# Patient Record
Sex: Female | Born: 1950 | Race: White | Hispanic: No | Smoking: Former smoker
Health system: Southern US, Community
[De-identification: ages and names within clinical notes are randomized; demographics above are authoritative.]

## PROBLEM LIST (undated history)

## (undated) DIAGNOSIS — I509 Heart failure, unspecified: Secondary | ICD-10-CM

## (undated) DIAGNOSIS — T4145XA Adverse effect of unspecified anesthetic, initial encounter: Secondary | ICD-10-CM

## (undated) DIAGNOSIS — C801 Malignant (primary) neoplasm, unspecified: Secondary | ICD-10-CM

## (undated) DIAGNOSIS — M1990 Unspecified osteoarthritis, unspecified site: Secondary | ICD-10-CM

## (undated) DIAGNOSIS — T8859XA Other complications of anesthesia, initial encounter: Secondary | ICD-10-CM

## (undated) DIAGNOSIS — G4730 Sleep apnea, unspecified: Secondary | ICD-10-CM

## (undated) DIAGNOSIS — J449 Chronic obstructive pulmonary disease, unspecified: Secondary | ICD-10-CM

## (undated) DIAGNOSIS — R0600 Dyspnea, unspecified: Secondary | ICD-10-CM

## (undated) DIAGNOSIS — Z8489 Family history of other specified conditions: Secondary | ICD-10-CM

## (undated) HISTORY — PX: FRACTURE SURGERY: SHX138

## (undated) HISTORY — PX: COLONOSCOPY: SHX174

## (undated) HISTORY — PX: ABDOMINAL HYSTERECTOMY: SHX81

## (undated) HISTORY — PX: BREAST SURGERY: SHX581

---
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
## 1998-09-29 HISTORY — PX: MASTECTOMY: SHX3

## 2007-10-29 ENCOUNTER — Ambulatory Visit: Payer: Self-pay | Admitting: Family Medicine

## 2008-06-24 ENCOUNTER — Ambulatory Visit: Payer: Self-pay | Admitting: Family Medicine

## 2008-06-25 ENCOUNTER — Ambulatory Visit: Payer: Self-pay | Admitting: Family Medicine

## 2008-06-26 ENCOUNTER — Ambulatory Visit: Payer: Self-pay | Admitting: Family Medicine

## 2011-02-16 ENCOUNTER — Ambulatory Visit: Payer: Self-pay | Admitting: Internal Medicine

## 2019-04-27 ENCOUNTER — Other Ambulatory Visit: Payer: Self-pay

## 2019-04-27 ENCOUNTER — Encounter
Admission: RE | Admit: 2019-04-27 | Discharge: 2019-04-27 | Disposition: A | Discharge status: Home / Self Care | Payer: Medicare Other | Source: Ambulatory Visit | Attending: Surgery | Admitting: Surgery

## 2019-04-27 DIAGNOSIS — I10 Essential (primary) hypertension: Secondary | ICD-10-CM | POA: Diagnosis not present

## 2019-04-27 DIAGNOSIS — F418 Other specified anxiety disorders: Secondary | ICD-10-CM | POA: Diagnosis not present

## 2019-04-27 DIAGNOSIS — Z79899 Other long term (current) drug therapy: Secondary | ICD-10-CM | POA: Insufficient documentation

## 2019-04-27 DIAGNOSIS — G4733 Obstructive sleep apnea (adult) (pediatric): Secondary | ICD-10-CM | POA: Diagnosis not present

## 2019-04-27 DIAGNOSIS — Z20828 Contact with and (suspected) exposure to other viral communicable diseases: Secondary | ICD-10-CM | POA: Diagnosis not present

## 2019-04-27 DIAGNOSIS — F17210 Nicotine dependence, cigarettes, uncomplicated: Secondary | ICD-10-CM | POA: Diagnosis not present

## 2019-04-27 DIAGNOSIS — J449 Chronic obstructive pulmonary disease, unspecified: Secondary | ICD-10-CM | POA: Diagnosis not present

## 2019-04-27 DIAGNOSIS — Z01812 Encounter for preprocedural laboratory examination: Secondary | ICD-10-CM | POA: Diagnosis present

## 2019-04-27 DIAGNOSIS — M1711 Unilateral primary osteoarthritis, right knee: Secondary | ICD-10-CM | POA: Diagnosis not present

## 2019-04-27 HISTORY — DX: Chronic obstructive pulmonary disease, unspecified: J44.9

## 2019-04-27 HISTORY — DX: Sleep apnea, unspecified: G47.30

## 2019-04-27 HISTORY — DX: Malignant (primary) neoplasm, unspecified: C80.1

## 2019-04-27 HISTORY — DX: Other complications of anesthesia, initial encounter: T88.59XA

## 2019-04-27 HISTORY — DX: Dyspnea, unspecified: R06.00

## 2019-04-27 HISTORY — DX: Family history of other specified conditions: Z84.89

## 2019-04-27 HISTORY — DX: Unspecified osteoarthritis, unspecified site: M19.90

## 2019-04-27 HISTORY — DX: Heart failure, unspecified: I50.9

## 2019-04-27 LAB — BASIC METABOLIC PANEL
Anion gap: 11 (ref 5–15)
BUN: 25 mg/dL — ABNORMAL HIGH (ref 8–23)
CO2: 29 mmol/L (ref 22–32)
Calcium: 8.9 mg/dL (ref 8.9–10.3)
Chloride: 100 mmol/L (ref 98–111)
Creatinine, Ser: 1.16 mg/dL — ABNORMAL HIGH (ref 0.44–1.00)
GFR calc Af Amer: 56 mL/min — ABNORMAL LOW (ref >60)
GFR calc non Af Amer: 48 mL/min — ABNORMAL LOW (ref >60)
Glucose, Bld: 96 mg/dL (ref 70–99)
Potassium: 4.2 mmol/L (ref 3.5–5.1)
Sodium: 140 mmol/L (ref 135–145)

## 2019-04-27 LAB — TYPE AND SCREEN
ABO/RH(D): O POS
Antibody Screen: NEGATIVE

## 2019-04-27 LAB — PROTIME-INR
INR: 0.9 (ref 0.8–1.2)
Prothrombin Time: 12.2 seconds (ref 11.4–15.2)

## 2019-04-27 LAB — SURGICAL PCR SCREEN
MRSA, PCR: NEGATIVE
Staphylococcus aureus: NEGATIVE

## 2019-04-27 LAB — APTT: aPTT: 29 seconds (ref 24–36)

## 2019-04-27 LAB — URINALYSIS, ROUTINE W REFLEX MICROSCOPIC
Bilirubin Urine: NEGATIVE
Glucose, UA: NEGATIVE mg/dL
Hgb urine dipstick: NEGATIVE
Ketones, ur: NEGATIVE mg/dL
Leukocytes,Ua: NEGATIVE
Nitrite: NEGATIVE
Protein, ur: NEGATIVE mg/dL
Specific Gravity, Urine: 1.025 (ref 1.005–1.030)
pH: 6 (ref 5.0–8.0)

## 2019-04-27 LAB — CBC
HCT: 44.3 % (ref 36.0–46.0)
Hemoglobin: 14 g/dL (ref 12.0–15.0)
MCH: 31.3 pg (ref 26.0–34.0)
MCHC: 31.6 g/dL (ref 30.0–36.0)
MCV: 99.1 fL (ref 80.0–100.0)
Platelets: 226 10*3/uL (ref 150–400)
RBC: 4.47 MIL/uL (ref 3.87–5.11)
RDW: 13.1 % (ref 11.5–15.5)
WBC: 9.4 10*3/uL (ref 4.0–10.5)
nRBC: 0 % (ref 0.0–0.2)

## 2019-04-27 LAB — SEDIMENTATION RATE: Sed Rate: 9 mm/hr (ref 0–30)

## 2019-04-27 NOTE — Pre-Procedure Instructions (Signed)
Progress Notes - documented in this encounter Elaine Nevin, MD - 11/30/2018 1:40 PM EST Formatting of this note might be different from the original. Established Patient Visit   Reason for referral: Chief Complaint  Patient presents with  . Follow-up  Date of Service: 11/30/2018 Date of Birth: 01/01/1951 PCP: Valera Castle, MD  History of Present Illness: Ms. Tomassetti is a 68 y.o.female patient who has Depression; Irritability; Chronic low back pain; Moderate tobacco use disorder; Anxiety; Sinus tachycardia; Acute respiratory failure with hypoxia and hypercapnia (CMS-HCC); High serum lactate; Severe OSA. Worse in REM. Baseline hypoxia. (SPLIT-CMS 10/21/17: Overall AHI 75/hr, REM AHI 109/hr, NREM AHI 73/hr, Lateral AHI 75/hr); Tobacco use disorder, moderate, in early remission; COPD (chronic obstructive pulmonary disease) (CMS-HCC); Elevated hematocrit; Mild NICM (nonischemic cardiomyopathy) (CMS-HCC); Wheezing; Situational anxiety; Essential hypertension; Primary osteoarthritis of right knee; and Obesity (BMI 35.0-39.9 without comorbidity), unspecified on their problem list.   Goes by "J"  Cardiac Risk Factors: 68 y.o. 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 Tobacco, COPD chronic bronchitis, severe OSA on CPAP. Body mass index is 33.94 kg/m. Bipolar affective disorder.  Previously: Respiratory Failure: 06/2017 hospitalization 10 days, treatment with BiPAP. Acute hypoxic / hypercapneic. Nebs helped with treatment prior to hospitalization, but also some chest tightness like a rubber band around chest. Overall felt to be COPD exacerbation in setting of recent viral illness. Basic viral panel negative, extended panel (+) for rhinovirus. Burst prednisone, and moxifloxacin. Followed by outpatient sleep study and diagnosis of severe obstructive sleep apnea. Coronary Calcifications: Mild coronary calcifications noted on unrelated CT scan 2019.  11/18/2017 At the Initial Consultation:  for assessment of procedural cardiovascular risk prior to surgery for excision of malignant melanoma, right upper back (+margins), scheduled 11/24/2017. Assessment of Perioperative Risk: low risk for this low risk excision of melanoma on her back. Does not require additional testing prior to surgery. Chest Discomfort: intermittently heavy sensation in chest that takes her breath. Brief. Had this symptom while auscultated.  Palpitations: sounds like occasional ectopy. May consider echocardiogram in the future, but not preoperatively. Respiratory Failure: sounds like this was COPD exacerbation and not CHF.  5/19-5/24/2019 [facility] Hospitalization:  Shortness of Breath: presents from home c/o progressive dyspnea for past 2 weeks, with significant worsening over the past 2 days. She is in the process of moving out of her current home (lived there for 6-7y, hold home w/ dust that exacerbates bronchial asthma) and has been more stressed, sleeping less, and packing her belongings w/ exposure to dust in her home.  Treated for COPD exacerbation and newly diagnosed heart failure.  COPD: treated with 5 days of prednisone and bronchodilators and improved clinically. At the time of discharge her wheezing had resolved and her cough had vastly improved.  Congestive Heart Failure: mildly volume overloaded on presentation, TTE checked (see below) revealing EF of 40 percent. Cardiology recommended cMRI, negative for ischemia, EF 48%. Started on ACEI, coreg 3.125, statin 40 mg.  03/18/2018 Visit:  Onc: clean margins on surgery, per patient Pulmonary: saw this week, very early COPD versus small airways dz. Home O2Sat 89-94% Sleep Apnea: Autumn Patty recommended BiPAP titration: Poor tolerance of CPAP due to excessive pressures and high mask leaks. BiPAP at 19/12 cwp had significant benefit (AHI 1/hr at this pressure). Used FFM. Diuretic: only takes furosemide when she needs it. Blood Pressure: on lisinopril 2.5 daily and  carvedilol 3.125 BID. Doesn't check at home. Fatigue: very tired after not much exertion. Going on since hospital (? Start  of carvedilol). Stop carvedilol, double lisinopril, to 69m. Diuretic: furosemide PRN  11/30/2018 Visit:  Volume Status: 10/13/2018 11/10/2018 11/16/2018  NT-Pro-BNP <=350 pg/mL 384 (H) 782 (H) 669 (H)  GI: awaiting colonoscopy, started furosemide again (had been PRN) because of elevated Pro-BNP Congestive Heart Failure: no orthopnea / PND but DOE after 4-5 minutes walking on flat. Thighs get very weak (bilateral) after walking a few minutes. OSA on BiPAP: good adherence. Every night, but not all night, but most of the night (and sometimes all). Diuretic: daily since 2/12 when Pro-BNP returned high (three weeks ago). Ok to reduce to every other day. Fatigue: "no energy and I'm tired all the time." Energy is "going downhill." Has to pause during housework. Pulmonary: Sees Dr. OLawerance Cruel(pulm) 4/16.  Tobacco: Currently smoking 1/2ppd. At her max she was smoking 2ppd. Plans to get chantix.  Pulmonary Function Testing: The FVC and FEV1 are reduced in a pattern suggestive of combined obstruction and restriction. Cannot accurately estimate the severity of obstruction in the setting of a reduced FVC.   Review of Systems  Constitution: Negative for decreased appetite, fever, night sweats and weight loss.  HENT: Negative for hearing loss, hoarse voice and sore throat.  Cardiovascular: Positive for chest pain. Negative for syncope.  Respiratory: Positive for shortness of breath and wheezing. Negative for cough and hemoptysis.  Skin: Negative for color change and rash.  Musculoskeletal: Positive for joint pain and myalgias. Negative for joint swelling.  Gastrointestinal: Negative for abdominal pain, change in bowel habit, constipation, diarrhea, melena, nausea and vomiting.  Genitourinary: Negative for dysuria, frequency and hesitancy.  Neurological: Positive for loss of balance. Negative  for excessive daytime sleepiness and paresthesias.  Psychiatric/Behavioral: Positive for memory loss.   Past Medical and Surgical History   Past Medical History:  Diagnosis Date  . Acute respiratory failure with hypoxia and hypercapnia (CMS-HCC) 07/14/2017  . Anxiety 07/13/2013  . Asthma  . Chronic low back pain  . COPD (chronic obstructive pulmonary disease) (CMS-HCC) 07/14/2017  . Depression  . Elevated hematocrit 12/09/2017  . Essential hypertension 03/29/2018  . High serum lactate 07/14/2017  . History of anesthesia reaction  reports she requires quite a bit of anesthesia to go to sleep  . 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 Melanoma of back (CMS-HCC) 11/04/2017  . Mild NICM (nonischemic cardiomyopathy) (CMS-HCC) 03/18/2018  . OSA (obstructive sleep apnea)  . Respiratory failure (CMS-HCC)  on Bipap, 10 day hospital stay October 2018  . Tobacco use   Past Surgical History She has a past surgical history that includes Hysterectomy; ankle surgery (Right, 1980); Tonsillectomy; Incisional Biopsy Breast; Breast surgery (1997); Colonoscopy; excision malignant lesion back (Right, 11/24/2017); pr resuperf wnd face >30 cm (Right, 11/24/2017); Mastectomy (Bilateral); and colonoscopy (N/A, 11/17/2018).   Medications and Allergies   Current Outpatient Medications  Medication Sig Dispense Refill  . acetaminophen (TYLENOL) 325 MG tablet Take 162.5 mg by mouth 2 (two) times daily as needed for Pain  . albuterol (ACCUNEB) 0.63 mg/3 mL nebulizer solution Take 0.63 mg by nebulization every 6 (six) hours as needed for Wheezing  . atorvastatin (LIPITOR) 40 MG tablet Take 1 tablet (40 mg total) by mouth once daily 90 tablet 3  . buPROPion (WELLBUTRIN XL) 300 MG XL tablet Take 300 mg by mouth once daily 0  . carvedilol (COREG) 3.125 MG tablet Take 1 tablet (3.125 mg total) by mouth every 12 (twelve) hours 180 tablet 3  .  dextromethorphan-guaifenesin (MUCINEX DM) 30-600 mg ER tablet Take 1 tablet by mouth every 12 (twelve) hours as needed  . divalproex (DEPAKOTE ER) 500 MG ER tablet Take 1,000 mg by mouth nightly 0  . fluticasone propionate (FLONASE) 50 mcg/actuation nasal spray Place 2 sprays into both nostrils once daily as needed for Rhinitis  . FUROsemide (LASIX) 20 MG tablet Take 1 tablet (20 mg total) by mouth once daily as needed for Edema (use if weight gain is 2-3 pounds from previous day) 30 tablet 11  . guaiFENesin (MUCUS RELIEF) 400 mg Tab tablet Take 400 mg by mouth  . hydrocortisone 2.5 % cream Apply topically once daily as needed  . ibuprofen (MOTRIN) 200 MG tablet Take 200 mg by mouth every 6 (six) hours as needed for Pain  . ipratropium-albuterol (COMBIVENT RESPIMAT) 20-100 mcg/actuation inhaler Inhale 1 inhalation into the lungs 4 (four) times daily 12 g 3  . ipratropium-albuterol (DUO-NEB) nebulizer solution Take 3 mLs by nebulization 4 (four) times daily as needed for Wheezing or Shortness of Breath 180 mL 11  . lisinopril (ZESTRIL) 5 MG tablet Take 1 tablet (5 mg total) by mouth once daily 90 tablet 3  . potassium chloride (KLOR-CON) 20 MEQ ER tablet Take 1 tablet (20 mEq total) by mouth 2 (two) times daily 180 tablet 3  . traZODone (DESYREL) 50 MG tablet Take 100 mg by mouth nightly  . venlafaxine (EFFEXOR-XR) 75 MG XR capsule Take 75 mg by mouth once daily 0  . Herbal Supplement Take 2 capsules by mouth once daily STACKER 3  . HYDROcodone-acetaminophen (NORCO) 5-325 mg tablet TAKE 1 TO 2 TABLETS BY MOUTH EVERY 6 TO 8 HOURS AS NEEDED FOR PAIN  . meloxicam (MOBIC) 7.5 MG tablet Take 1 tablet (7.5 mg total) by mouth once daily as needed for Pain (Patient not taking: Reported on 11/30/2018 ) 30 tablet 3   No current facility-administered medications for this visit.   Allergies: Patient has no known allergies.  Social and Family History   Social History   Tobacco Use  . Smoking status:  Current Every Day Smoker  Packs/day: 0.80  Years: 45.00  Pack years: 36.00  Types: Cigarettes  . Smokeless tobacco: Never Used  . Tobacco comment: Quit on 07/08/2017, but has now resumed smoking as of the spring of 2019  Substance Use Topics  . Alcohol use: Yes  Comment: ocassional beer or two or vodka/tonic - perhaps 1 every 2 weeks  . Drug use: No   Family History: family history includes Anesthesia problems in her sister; Bipolar disorder in her father; Breast cancer in her mother; COPD in her father; Colon polyps in her mother; Myocardial Infarction (Heart attack) in her father and mother.  Physical Examination  BP 106/68 (BP Location: Right upper arm, Patient Position: Sitting, BP Cuff Size: Large Adult)  Pulse 87  Ht 162.6 cm ('5\' 4"' )  Wt 89.7 kg (197 lb 12 oz)  BMI 33.94 kg/m  Physical Exam  Constitutional: She appears healthy. No distress.  HENT:  Mouth/Throat: Oropharynx is clear.  Eyes: Pupils are equal, round, and reactive to light. Conjunctivae are normal.  Neck: Normal range of motion. No JVD present. No neck adenopathy. No thyromegaly present.  Cardiovascular: Normal rate, regular rhythm and normal heart sounds.  Pulses: Carotid pulses are 2+ on the right side and 2+ on the left side. Radial pulses are 2+ on the right side and 2+ on the left side.  Femoral pulses are 2+ on the right side  and 2+ on the left side. Dorsalis pedis pulses are 2+ on the right side and 2+ on the left side.  Posterior tibial pulses are 2+ on the right side and 2+ on the left side.  Pulmonary/Chest: Effort normal. No stridor. She has no wheezes. She has no rales. She exhibits no tenderness.  Abdominal: Soft. Bowel sounds are normal. She exhibits no distension and no mass. There is no abdominal tenderness.  Musculoskeletal:  General: No edema.  Neurological: She is alert.  Skin: Skin is warm and dry. No cyanosis. No jaundice or plethora. Nails show no clubbing.   Lab and Other Data    10/21/2017 Split Sleep Study: Severe obstructive sleep apnea (AHI 75/hr). Apnea was worse during REM sleep. CPAP at 18 cwp captured REM, but not supine, sleep and had significant benefit in improving the patient's quality of sleep (AHI 1/hr at this pressure). Hypoxia with a baseline SpO2 of 86 during PAP titration.  02/15/2018 Echocardiogram: MODERATE LV DYSFUNCTION (LVEF 40%) NORMAL RIGHT VENTRICULAR SYSTOLIC FUNCTION VALVULAR REGURGITATION: MILD AR, MILD MR, TRIVIAL PR, TRIVIAL TR NO PRIOR STUDY FOR COMPARISON  02/18/2018 Cardiac MRI: Non-ischemic dilated cardiomyopathy. Left ventricle mildly dilated. There is mild concentric LV hypertrophy. Global systolic function mildly reduced, LV ejection fraction 48%. Global mild hypokinesis. Right ventricle normal in size, wall thickness, and systolic function.Both atria mildly enlarged.Aortic valve is trileaflet in morphology without significant stenosis. Peak aortic flow velocity is 1.8 m/s. There is mild-moderate aortic regurgitation. Mild pulmonic regurgitation and trivial mitral and tricuspid regurgitation. Delayed enhancement imaging demonstrates no evidence of myocardial infarction, scar or infiltrative disease. Regadenoson stress perfusion imaging demonstrates no inducible ischemia. No intracardiac thrombus visualized.  Assessment and Plan   1. Mild NICM (nonischemic cardiomyopathy) (CMS-HCC)  2. Essential hypertension  3. Other emphysema (CMS-HCC)    Dyspnea on Exertion: relatively benign cardiac MRI. Most salient abnormalities are from her PFTs. She sees pulmonary soon and continues to smoke (plans to do chantix when discount paperwork goes through). Diuretic: now daily because Pro-BNP was high; that said, no orthopnea, paroxysmal nocturnal dyspnea, edema. Reduce to QOD and continue to adjust clinically. Disposition: Return in about 6 months (around 06/02/2019).   Requested Prescriptions   No prescriptions requested or ordered in this  encounter   Non-Medicine Orders: No orders of the defined types were placed in this encounter.  Future Appointments  Date Time Provider [place]  12/09/2018 3:15 PM Scarlett Presto, [place] [facility]  01/13/2019 9:30 AM STUDIES/LAB-ASTHMA, Reggie Pile [facility]  01/13/2019 10:00 AM Dow Adolph, MD [facility]  02/14/2019 10:20 AM Valera Castle, MD [facility] [place]  03/25/2019 10:00 AM Taxman, Danice Goltz, PA [facility]     Electronically signed by Elaine Nevin, MD at 11/30/2018 4:59 PM EST   Plan of Treatment - documented as of this encounter Upcoming Encounters Upcoming Encounters  Date Type Specialty Care Team Description  04/29/2019 Office Visit Orthopaedics Poggi, Smith Mince, MD  [place]  [facility] [place], [place] [address]  [phone]  [phone] ([address])    Scarlett Presto, [place]  [place], [place] [address]  [phone]  [phone] (Fax)    05/19/2019 Post Op Orthopaedics Scarlett Presto, [place]  [place], [place] [address]  [phone]  [phone] (Fax)    05/19/2019 PT/OT Office Visit Physical Therapy Juanda Chance, PT    06/22/2019 Post Op Orthopaedics Poggi, Smith Mince, MD  [place]  [facility] [place], [place] [address]  [phone]  [phone] (Fax)     Visit Diagnoses - documented in this encounter Diagnosis  Mild NICM (nonischemic cardiomyopathy) (CMS-HCC) - Primary   Essential hypertension   Other emphysema (CMS-HCC)  Other emphysema    Historical Medications - added in this encounter This list may reflect changes made after this encounter.  Medication Sig Dispensed Refills Start Date End Date  guaiFENesin (MUCUS RELIEF) 400 mg Tab tablet  Take 400 mg by mouth  0    Images  Patient Contacts  Contact Name Contact Address Communication Relationship  to Patient  Braylea Brancato Unknown [phone] [facility]) [phone] (Mobile) Son or Daughter, Emergency Contact  Document Information  Primary Care Provider Other Service Providers Document Coverage Dates  Valera Castle, MD (Oct. 16, 2018October 16, 2018 - Present) DM: [phone] (Work) [phone] (Fax) [place], [place] [address] Family Medicine [facility] [place], [place] [address] Jannet Mantis, MD (Consulting Provider) DM: [phone] (Work) [phone] (Fax) [address] [place], [place] [address] Dermatology Elaine Nevin, MD (Cardiologist) DM: [phone] (Work) [phone] (Fax) [address] [place], [place] [address] Cardiovascular Disease [facility] [place], [place] [address] Mar. 03, 2020March 03, 2020   [place] [address] [place],  [address]   Encounter Providers Encounter Date  Elaine Nevin, MD (Attending) DM: [phone] (Work) [phone] (Fax) [phone] [place],  [address] Cardiovascular Disease Mar. 03, 2020March 03, 2020

## 2019-04-27 NOTE — Pre-Procedure Instructions (Signed)
ECG 12-lead2/08/2019 [place] Component Name Value Ref Range  Vent Rate (bpm) 66   PR Interval (msec) 132   QRS Interval (msec) 104   QT Interval (msec) 396   QTc (msec) 415   Other Result Information  This result has an attachment that is not available.  Result Narrative  Normal sinus rhythm Left atrial enlargement Left axis deviation less prominent, Left anterior fascicular block now absent Nonspecific ST depression Nonspecific T wave abnormalities, Inferolateral leads Abnormal ECG  When compared with ECG of 10-Nov-2018 10:37, Nonspecific ST depression is now present Nonspecific T wave abnormalities, Inferolateral leads are now present I reviewed and concur with this report. Electronically signed HQ:IONGE, MD, AUGUSTUS ([id number]) on 11/10/2018 7:44:07 PM  Status Results Details

## 2019-04-27 NOTE — Pre-Procedure Instructions (Signed)
Progress Notes - documented in this encounter Dow Adolph, MD - 07/15/2018 10:40 AM EDT Formatting of this note might be different from the original.   Primary Care Provider: Valera Castle, MD  Problem List: - COPD with peripheral eosinophilia  - Ground glass pulmonary nodule  - Severe OSA (AHI 75) on BiPAP  - Obesity - Tobacco abuse, ongoing   Chief Complaint: routine COPD follow up   Patient Summary: This is a 68 y.o. female who is a former patient of Dr. Tamsen Roers seen for COPD with recurrent exacerbations requiring hospitalization, last in 01/2018.   Interval History:  Was in the hospital in May for respiratory failure, has since recovered from that perspective  For the last 2 months she has been doing very well. She has stopped Symbicort and the nebulizer for the last 2 and a half months. She thinks she may need to restart because of the change of weather. She has started to develop a cough. May notice some increased shortness of breath.   She is not currently needing Combivent.   She typically stops her respiratory medications when she is feeling well, has never been told to use consistently per her recollection.   Now she is on BiPAP nocturnally which has overall improved her sleep quality. Occasionally wakes up at night. When she was on CPAP alone, she was waking up 4-6 times per night.   Currently smoking 1/2ppd. At her max she was smoking 2ppd. Admits she is under a lot of stress and pressure. She has tried patches and gum in the past. She has seen our smoking cessation program once. She would prefer to get her life settled before she goes to smoking cessation. She is taking care of her sister's children right now who suffer from ADHD, PTSD, anxiety and so spends a lot of her time with the kids and going to the psychiatrist for them.   Review of Systems.  Review of Systems  Constitutional: Positive for malaise/fatigue.  HENT: Negative.  Respiratory:  Positive for cough and shortness of breath. Negative for sputum production and wheezing.  Cardiovascular: Negative for chest pain.  Psychiatric/Behavioral:  Multiple stressors in life   Allergies: No Known Allergies  Current Medications: Current Outpatient Medications  Medication Sig Dispense Refill   atorvastatin (LIPITOR) 40 MG tablet Take 1 tablet (40 mg total) by mouth once daily 30 tablet 3   buPROPion (WELLBUTRIN XL) 150 MG XL tablet 0   carvedilol (COREG) 3.125 MG tablet Take 3.125 mg by mouth every 12 (twelve) hours 3   cetirizine (ZYRTEC) 10 mg capsule Take 1 capsule (10 mg total) by mouth once daily 30 capsule 11   dextromethorphan-guaifenesin (MUCINEX DM) 30-600 mg ER tablet Take 1 tablet by mouth every 12 (twelve) hours as needed   divalproex (DEPAKOTE ER) 250 MG ER tablet 0   fluticasone (FLONASE) 50 mcg/actuation nasal spray Place 2 sprays into both nostrils once daily 16 g 11   FUROsemide (LASIX) 20 MG tablet Take 1 tablet (20 mg total) by mouth once daily as needed for Edema (use if weight gain is 2-3 pounds from previous day) 30 tablet 11   Herbal Supplement Take 2 capsules by mouth once daily STACKER 3   hydrocortisone 2.5 % cream Apply topically once daily as needed   ipratropium-albuterol (DUO-NEB) nebulizer solution Take 3 mLs by nebulization 4 (four) times daily as needed for Wheezing or Shortness of Breath 180 mL 11   lisinopril (PRINIVIL,ZESTRIL) 5 MG tablet Take 1  tablet (5 mg total) by mouth once daily 90 tablet 3   meloxicam (MOBIC) 7.5 MG tablet Take 1 tablet (7.5 mg total) by mouth once daily as needed for Pain 30 tablet 3   montelukast (SINGULAIR) 10 mg tablet Take 1 tablet (10 mg total) by mouth nightly 30 tablet 11   venlafaxine (EFFEXOR-XR) 37.5 MG XR capsule 0   budesonide-formoterol (SYMBICORT) 160-4.5 mcg/actuation inhaler Inhale 2 inhalations into the lungs 2 (two) times daily 3 Inhaler 3   ipratropium-albuterol (COMBIVENT RESPIMAT)  20-100 mcg/actuation inhaler Inhale 2 inhalations into the lungs 4 (four) times daily 1 Inhaler 11   No current facility-administered medications for this visit.   Physical Exam: Vitals:  07/15/18 1029  BP: 142/82  BP Location: Right upper arm  Patient Position: Sitting  BP Cuff Size: Large Adult  Pulse: 79  Resp: 18  Temp: 36.4 C (97.6 F)  TempSrc: Oral  SpO2: 95%  Weight: 95 kg (209 lb 7 oz)  Height: 162.6 cm (5' 4.02")   Body mass index is 35.93 kg/m. Gen: alert and oriented, no acute distress, +tobacco smell  Cardiovascular: regular rate and rhythm, normal s1s2 Lungs: diminished but clear to ausculation bilaterally, no wheezing, rhonchi or crackles Extremities: no edema  Labs/Data/Imaging Review:  Lab Results  Component Value Date  WBC 9.1 06/28/2018  HGB 14.9 06/28/2018  HCT 45.2 (H) 06/28/2018  MCV 95 06/28/2018  PLT 202 06/28/2018   Absolute eosinophil count 400 (06/28/2018) Lab Results  Component Value Date  CREATININE 1.0 06/28/2018  BUN 21 (H) 06/28/2018  NA 143 06/28/2018  K 4.3 06/28/2018  CL 104 06/28/2018  CO2 27 06/28/2018   Lab Results  Component Value Date  ALT 14 02/14/2018  AST 19 02/14/2018  ALKPHOS 72 02/14/2018   Pulmonary Function Tests (03/15/2018): Personally interpreted. The FVC and FEV1 are reduced in a pattern suggestive of combined obstruction and restriction. Cannot accurately estimate the severity of obstruction in the setting of a reduced FVC.   Pulmonary Function Test (PFT) Latest Ref Rng & Units 07/27/2017 03/15/2018  FVC PRE L 2.22 2.06  FVC % PRE PRED % 77.4 68.2  FEV1 PRE L 1.64 1.52  FEV1 % PRE PRED % 73.7 65.4  FEV1/FVC PRE % 73.73 73.85  TLC PRE L 4.47 -  TLC % PRE PRED % 90 -  RV PRE L 2.25 -  RV % PRE PRED % 107.2 -  DLCO PRE ml/(min*mmHg) 16.82 16.17  DLCO % PRE PRED % 93.9 88.8  FEF25-75% PRE L/s 1.18 1.05  FEF25-75% % PRE PRED % 56.6 49.3   Assessment: 1. COPD with peripheral eosinophilia 2. Tobacco  abuse, ongoing  3. Medication non-adherence 4. LUL pulmonary nodule 5. Severe OSA on BiPAP   Discussion: She is overall doing well, but has recurrent exacerbations likely related to her stopping her medications when she is feeling well. I do agree that ICS based therapy should be a part of her therapy even though her primary diagnosis is asthma given her history of peripheral eosinophilia. I spent today educating her on why controller therapies need to be used continuously rather than prn and she was appreciative of that information.   For her L apical pulmonary nodule, will repeat CT chest in 6 months.   We discussed smoking cessation in detail today, she plans to fill her Chantix prescription but is not yet ready for a smoking cessation referral.   Plan: - CT chest in November for 6 months follow up of LUL pulmonary  nodule  - Restart Symbicort 160/4.5 2 puff BID and use consistently whether feeling well or now - She will meet with our [facility] due to concerns for cost  - Continue Combivent or Duoneb prn (refilled today) - Smoking cessation counseling 5-10 minutes.  - Declined smoking cessation counseling referral  - Plans to start Chantix soon  - Continue BiPAP for OSA  - She received her flu shot this year already - Plans to get her pneumovax after 07/27/18 of this year  - Up to date on prevnar   Diagnoses and all orders for this visit:  COPD, mild , unspecified (CMS-HCC) - ipratropium-albuterol (DUO-NEB) nebulizer solution; Take 3 mLs by nebulization 4 (four) times daily as needed for Wheezing or Shortness of Breath - ipratropium-albuterol (COMBIVENT RESPIMAT) 20-100 mcg/actuation inhaler; Inhale 2 inhalations into the lungs 4 (four) times daily - Flow Volume Loop; Future  Lung nodule - CT chest without contrast; Future  Chronic obstructive pulmonary disease, unspecified COPD type (CMS-HCC) - budesonide-formoterol (SYMBICORT) 160-4.5 mcg/actuation inhaler; Inhale 2 inhalations into  the lungs 2 (two) times daily  Immunizations:  Immunization History  Administered Date(s) Administered   Influenza IIV3, IM High Dose 07/21/2017, 06/28/2018   Pneumococcal Conjugate 13-Valent (Prevnar 13) 07/27/2017   Tdap 06/22/2015   Varicella zoster (Shingrix) 03/11/2018   Return in about 6 months (around 01/14/2019) for COPD follow up.  Claris Pong, MD [facility]  I personally performed the service. (TP)    Electronically signed by Dow Adolph, MD at 07/15/2018 11:29 AM EDT   Plan of Treatment - documented as of this encounter Upcoming Encounters Upcoming Encounters  Date Type Specialty Care Team Description  04/29/2019 Office Visit Orthopaedics Poggi, Smith Mince, MD  [place]  [facility] [place], [place] [address]  [phone]  [phone] ([address]    Scarlett Presto, [place]  [place], [place] [address]  [phone]  [phone] (Fax)    05/19/2019 Post Op Orthopaedics Scarlett Presto, [place]  [place], [place] [address]  [phone]  [phone] ([phone]    05/19/2019 PT/OT Office Visit Physical Therapy Juanda Chance, PT    06/22/2019 Post Op Orthopaedics Poggi, Smith Mince, MD  [place]  [facility] [place], [place] [address]  [phone]  [phone] (Fax)     Scheduled Orders Scheduled Orders  Name Type Priority Associated Diagnoses Order Schedule  Flow Volume Loop PFT Routine COPD, mild , unspecified (CMS-HCC)  Expected: 01/14/2019, Expires: 07/16/2019  CT chest without contrast with 3D MIPS Protocol Imaging Routine Lung nodule  Expected: 08/15/2018, Expires: 07/16/2019   Visit Diagnoses - documented in this encounter Diagnosis  Chronic obstructive pulmonary disease, unspecified COPD type (CMS-HCC) - Primary   Lung nodule  Other diseases of lung, not elsewhere classified   Tobacco abuse  Tobacco  use disorder   COPD, mild , unspecified (CMS-HCC)    Discontinued Medications - documented as of this encounter Medication Sig Discontinue Reason Start Date End Date  ipratropium-albuterol (COMBIVENT RESPIMAT) 20-100 mcg/actuation inhaler  Indications: Wheezing Inhale 1 inhalation into the lungs 4 (four) times daily.  06/22/2017 07/15/2018  ipratropium-albuterol (DUO-NEB) nebulizer solution  Indications: COPD, mild , unspecified (CMS-HCC) Take 3 mLs by nebulization 4 (four) times daily as needed for Wheezing or Shortness of Breath Reorder 01/13/2018 07/15/2018  Images  Patient Contacts  Contact Name Contact Address Communication Relationship to Patient  Avanni Turnbaugh Unknown [phone] [facility]) [phone] [facility]) Son  or Daughter, Emergency Contact  Document Information  Primary Care Provider Other Service Providers Document Coverage Dates  Valera Castle, MD (Oct. 16, 2018October 16, 2018 - Present) DM: [phone] (Work) [phone] (Fax) [place], [place] [address] Family Medicine [facility] [place], [place] [address] Jannet Mantis, MD (Consulting Provider) DM: [phone] (Work) [phone] (Fax) [address] [place], [place] [address] Dermatology Shela Nevin, MD (Cardiologist) DM: [phone] (Work) [phone] (Fax) [address] [place], [place] [address] Cardiovascular Disease [facility] [address] [place], [place] [address] Oct. 17, 2019October 17, 2019   [place] [address] [place], [place] [address]   Encounter Providers Encounter Date  Amber Charlotte Crumb, MD (Attending) DM: [phone] (Work) [phone] (Fax) [place], [place],  [address] Pulmonary Disease Oct. 17, 2019October 17, 2019

## 2019-04-27 NOTE — Patient Instructions (Addendum)
Your procedure is scheduled on: 05-04-19 [facility] Report to Same Day Surgery 2nd floor medical mall [facility] Entrance-take elevator on left to 2nd floor.  Check in with surgery information desk.) To find out your arrival time please call [phone] between 1PM - 3PM on 05-03-19 TUESDAY  Remember: Instructions that are not followed completely may result in serious medical risk, up to and including death, or upon the discretion of your surgeon and anesthesiologist your surgery may need to be rescheduled.    _x___ 1. Do not eat food after midnight the night before your procedure. NO GUM OR CANDY AFTER MIDNIGHT. You may drink clear liquids up to 2 hours before you are scheduled to arrive at the hospital for your procedure.  Do not drink clear liquids within 2 hours of your scheduled arrival to the hospital.  Clear liquids include  --Water or Apple juice without pulp  --Clear carbohydrate beverage such as ClearFast or Gatorade  --Black Coffee or Clear Tea (No milk, no creamers, do not add anything to the coffee or Tea   ____Ensure clear carbohydrate drink on the way to the hospital for bariatric patients  ____Ensure clear carbohydrate drink 3 hours before surgery.    __x__ 2. No Alcohol for 24 hours before or after surgery.   __x__3. No Smoking or e-cigarettes for 24 prior to surgery.  Do not use any chewable tobacco products for at least 6 hour prior to surgery   ____  4. Bring all medications with you on the day of surgery if instructed.    __x__ 5. Notify your doctor if there is any change in your medical condition     (cold, fever, infections).    x___6. On the morning of surgery brush your teeth with toothpaste and water.  You may rinse your mouth with mouth wash if you wish.  Do not swallow any toothpaste or mouthwash.   Do not wear jewelry, make-up, hairpins, clips or nail polish.  Do not wear lotions, powders, or perfumes. You may wear deodorant.  Do not shave 48 hours prior  to surgery. Men may shave face and neck.  Do not bring valuables to the hospital.    [facility] is not responsible for any belongings or valuables.               Contacts, dentures or bridgework may not be worn into surgery.  Leave your suitcase in the car. After surgery it may be brought to your room.  For patients admitted to the hospital, discharge time is determined by your treatment team.  _  Patients discharged the day of surgery will not be allowed to drive home.  You will need someone to drive you home and stay with you the night of your procedure.    Please read over the following fact sheets that you were given:   [facility] Preparing for Surgery and or MRSA Information   _x___ TAKE THE FOLLOWING MEDICATION THE MORNING OF SURGERY WITH A SMALL SIP OF WATER. These include:  1. LIPITOR (ATORVASTATIN)  2. EFFEXOR (VENLAFAXINE)  3. WELLBUTRIN (BUPROPRION)  4.  5.  6.  ____Fleets enema or Magnesium Citrate as directed.   _x___ Use CHG Soap or sage wipes as directed on instruction sheet   _X___ Use inhalers on the day of surgery and bring to hospital day of surgery-USE YOUR NEBULIZER AND COMBIVENT THE MORNING OF SURGERY  ____ Stop Metformin and Janumet 2 days prior to surgery.    ____ Take  1/2 of usual insulin dose the night before surgery and none on the morning surgery.   _x___ Follow recommendations from Cardiologist, Pulmonologist or PCP regarding stopping Aspirin, Coumadin, Plavix ,Eliquis, Effient, or Pradaxa, and Pletal.  X____Stop Anti-inflammatories such as Advil, Aleve, Ibuprofen, Motrin, Naproxen,MELOXICAM, Naprosyn, Goodies powders or aspirin products NOW-OK to take Tylenol OR TRAMADOL   _X___ Stop supplements until after surgery-STOP STACKER 3 NOW   _X___ Bring BI-Pap to the hospital.   [place] ON Friday, July 31ST AND COME FOR YOUR COVID DRIVE THRU BETWEEN 1BP-79:43 AM

## 2019-04-28 LAB — URINE CULTURE: Culture: <10000 — AB

## 2019-04-29 ENCOUNTER — Other Ambulatory Visit: Payer: Self-pay

## 2019-04-29 ENCOUNTER — Other Ambulatory Visit
Admission: RE | Admit: 2019-04-29 | Discharge: 2019-04-29 | Disposition: A | Discharge status: Home / Self Care | Payer: Medicare Other | Source: Ambulatory Visit | Attending: Surgery | Admitting: Surgery

## 2019-04-29 DIAGNOSIS — Z01812 Encounter for preprocedural laboratory examination: Secondary | ICD-10-CM | POA: Diagnosis not present

## 2019-04-29 LAB — SARS CORONAVIRUS 2 (TAT 6-24 HRS): SARS Coronavirus 2: NEGATIVE

## 2019-05-04 ENCOUNTER — Inpatient Hospital Stay: Payer: Medicare Other | Admitting: Anesthesiology

## 2019-05-04 ENCOUNTER — Other Ambulatory Visit: Payer: Self-pay

## 2019-05-04 ENCOUNTER — Inpatient Hospital Stay: Payer: Medicare Other

## 2019-05-04 ENCOUNTER — Encounter: Payer: Self-pay | Admitting: *Deleted

## 2019-05-04 ENCOUNTER — Encounter
Admission: RE | Disposition: A | Discharge status: Skilled Nursing Facility | Payer: Self-pay | Source: Home / Self Care | Attending: Surgery

## 2019-05-04 ENCOUNTER — Inpatient Hospital Stay
Admission: RE | Admit: 2019-05-04 | Discharge: 2019-05-09 | DRG: 469 | Disposition: A | Discharge status: Skilled Nursing Facility | Payer: Medicare Other | Attending: Surgery | Admitting: Surgery

## 2019-05-04 DIAGNOSIS — F329 Major depressive disorder, single episode, unspecified: Secondary | ICD-10-CM | POA: Diagnosis present

## 2019-05-04 DIAGNOSIS — Z79891 Long term (current) use of opiate analgesic: Secondary | ICD-10-CM | POA: Diagnosis not present

## 2019-05-04 DIAGNOSIS — Z79899 Other long term (current) drug therapy: Secondary | ICD-10-CM

## 2019-05-04 DIAGNOSIS — Z90710 Acquired absence of both cervix and uterus: Secondary | ICD-10-CM | POA: Diagnosis not present

## 2019-05-04 DIAGNOSIS — Z87891 Personal history of nicotine dependence: Secondary | ICD-10-CM

## 2019-05-04 DIAGNOSIS — I5032 Chronic diastolic (congestive) heart failure: Secondary | ICD-10-CM | POA: Diagnosis present

## 2019-05-04 DIAGNOSIS — G4733 Obstructive sleep apnea (adult) (pediatric): Secondary | ICD-10-CM | POA: Diagnosis present

## 2019-05-04 DIAGNOSIS — Z20828 Contact with and (suspected) exposure to other viral communicable diseases: Secondary | ICD-10-CM | POA: Diagnosis present

## 2019-05-04 DIAGNOSIS — F419 Anxiety disorder, unspecified: Secondary | ICD-10-CM | POA: Diagnosis present

## 2019-05-04 DIAGNOSIS — Z885 Allergy status to narcotic agent status: Secondary | ICD-10-CM

## 2019-05-04 DIAGNOSIS — Z9013 Acquired absence of bilateral breasts and nipples: Secondary | ICD-10-CM

## 2019-05-04 DIAGNOSIS — E875 Hyperkalemia: Secondary | ICD-10-CM | POA: Diagnosis not present

## 2019-05-04 DIAGNOSIS — J9621 Acute and chronic respiratory failure with hypoxia: Secondary | ICD-10-CM | POA: Diagnosis not present

## 2019-05-04 DIAGNOSIS — Z791 Long term (current) use of non-steroidal anti-inflammatories (NSAID): Secondary | ICD-10-CM | POA: Diagnosis not present

## 2019-05-04 DIAGNOSIS — J189 Pneumonia, unspecified organism: Secondary | ICD-10-CM | POA: Diagnosis not present

## 2019-05-04 DIAGNOSIS — I959 Hypotension, unspecified: Secondary | ICD-10-CM | POA: Diagnosis not present

## 2019-05-04 DIAGNOSIS — M1711 Unilateral primary osteoarthritis, right knee: Principal | ICD-10-CM | POA: Diagnosis present

## 2019-05-04 DIAGNOSIS — J440 Chronic obstructive pulmonary disease with acute lower respiratory infection: Secondary | ICD-10-CM | POA: Diagnosis not present

## 2019-05-04 DIAGNOSIS — Z853 Personal history of malignant neoplasm of breast: Secondary | ICD-10-CM | POA: Diagnosis not present

## 2019-05-04 DIAGNOSIS — M79661 Pain in right lower leg: Secondary | ICD-10-CM

## 2019-05-04 DIAGNOSIS — I110 Hypertensive heart disease with heart failure: Secondary | ICD-10-CM | POA: Diagnosis present

## 2019-05-04 DIAGNOSIS — E785 Hyperlipidemia, unspecified: Secondary | ICD-10-CM | POA: Diagnosis present

## 2019-05-04 DIAGNOSIS — Z96651 Presence of right artificial knee joint: Secondary | ICD-10-CM

## 2019-05-04 HISTORY — PX: TOTAL KNEE ARTHROPLASTY: SHX125

## 2019-05-04 LAB — ABO/RH: ABO/RH(D): O POS

## 2019-05-04 SURGERY — ARTHROPLASTY, KNEE, TOTAL
Anesthesia: Spinal | Laterality: Right

## 2019-05-04 MED ORDER — MIDAZOLAM HCL 5 MG/5ML IJ SOLN
INTRAMUSCULAR | Status: DC | PRN
  Administered 2019-05-04 (×2): 1 mg via INTRAVENOUS

## 2019-05-04 MED ORDER — BUPROPION HCL ER (XL) 150 MG PO TB24
300.0000 mg | ORAL_TABLET | Freq: Every morning | ORAL | Status: DC
  Administered 2019-05-05 – 2019-05-09 (×5): 300 mg via ORAL
  Filled 2019-05-04 (×5): qty 2

## 2019-05-04 MED ORDER — SODIUM CHLORIDE (PF) 0.9 % IJ SOLN
INTRAMUSCULAR | Status: AC
  Filled 2019-05-04: qty 50

## 2019-05-04 MED ORDER — SODIUM CHLORIDE 0.9 % IV SOLN
INTRAVENOUS | Status: DC | PRN
  Administered 2019-05-04: 60 mL

## 2019-05-04 MED ORDER — HYDROMORPHONE HCL 1 MG/ML IJ SOLN
0.5000 mg | INTRAMUSCULAR | Status: DC | PRN
  Administered 2019-05-04 – 2019-05-06 (×2): 1 mg via INTRAVENOUS
  Filled 2019-05-04 (×2): qty 1

## 2019-05-04 MED ORDER — OXYCODONE HCL 5 MG PO TABS
5.0000 mg | ORAL_TABLET | Freq: Once | ORAL | Status: AC
  Administered 2019-05-04: 5 mg via ORAL

## 2019-05-04 MED ORDER — LIDOCAINE HCL (PF) 2 % IJ SOLN
INTRAMUSCULAR | Status: AC
  Filled 2019-05-04: qty 10

## 2019-05-04 MED ORDER — BUPIVACAINE HCL (PF) 0.5 % IJ SOLN
INTRAMUSCULAR | Status: DC | PRN
  Administered 2019-05-04: 3 mL

## 2019-05-04 MED ORDER — BISACODYL 10 MG RE SUPP
10.0000 mg | Freq: Every day | RECTAL | Status: DC | PRN
  Administered 2019-05-07: 11:00:00 10 mg via RECTAL
  Filled 2019-05-04: qty 1

## 2019-05-04 MED ORDER — PROPOFOL 500 MG/50ML IV EMUL
INTRAVENOUS | Status: DC | PRN
  Administered 2019-05-04: 40 ug/kg/min via INTRAVENOUS

## 2019-05-04 MED ORDER — TRAZODONE HCL 100 MG PO TABS
100.0000 mg | ORAL_TABLET | Freq: Every day | ORAL | Status: DC
  Administered 2019-05-04 – 2019-05-08 (×5): 100 mg via ORAL
  Filled 2019-05-04 (×5): qty 1

## 2019-05-04 MED ORDER — ALBUTEROL SULFATE (2.5 MG/3ML) 0.083% IN NEBU: 2.5000 mg | INHALATION_SOLUTION | Freq: Four times a day (QID) | RESPIRATORY_TRACT | Status: DC | PRN

## 2019-05-04 MED ORDER — KETOROLAC TROMETHAMINE 15 MG/ML IJ SOLN
15.0000 mg | Freq: Once | INTRAMUSCULAR | Status: AC
  Administered 2019-05-04: 15 mg via INTRAVENOUS

## 2019-05-04 MED ORDER — FAMOTIDINE 20 MG PO TABS
20.0000 mg | ORAL_TABLET | Freq: Once | ORAL | Status: AC
  Administered 2019-05-04: 20 mg via ORAL

## 2019-05-04 MED ORDER — ORAL CARE MOUTH RINSE
15.0000 mL | Freq: Two times a day (BID) | OROMUCOSAL | Status: DC
  Administered 2019-05-04 – 2019-05-08 (×5): 15 mL via OROMUCOSAL

## 2019-05-04 MED ORDER — POTASSIUM CHLORIDE CRYS ER 20 MEQ PO TBCR
20.0000 meq | EXTENDED_RELEASE_TABLET | Freq: Two times a day (BID) | ORAL | Status: DC
  Administered 2019-05-04 – 2019-05-06 (×5): 20 meq via ORAL
  Filled 2019-05-04 (×6): qty 1

## 2019-05-04 MED ORDER — ENOXAPARIN SODIUM 40 MG/0.4ML ~~LOC~~ SOLN
40.0000 mg | SUBCUTANEOUS | Status: DC
  Administered 2019-05-05 – 2019-05-09 (×5): 40 mg via SUBCUTANEOUS
  Filled 2019-05-04 (×5): qty 0.4

## 2019-05-04 MED ORDER — CEFAZOLIN SODIUM-DEXTROSE 2-4 GM/100ML-% IV SOLN
2.0000 g | Freq: Once | INTRAVENOUS | Status: AC
  Administered 2019-05-04: 2 g via INTRAVENOUS

## 2019-05-04 MED ORDER — FLEET ENEMA 7-19 GM/118ML RE ENEM: 1.0000 | ENEMA | Freq: Once | RECTAL | Status: DC | PRN

## 2019-05-04 MED ORDER — BUPIVACAINE-EPINEPHRINE (PF) 0.5% -1:200000 IJ SOLN
INTRAMUSCULAR | Status: AC
  Filled 2019-05-04: qty 30

## 2019-05-04 MED ORDER — KETAMINE HCL 50 MG/ML IJ SOLN
INTRAMUSCULAR | Status: DC | PRN
  Administered 2019-05-04: 12.5 mg via INTRAMUSCULAR
  Administered 2019-05-04: 17.5 mg via INTRAMUSCULAR
  Administered 2019-05-04: 20 mg via INTRAMUSCULAR

## 2019-05-04 MED ORDER — VENLAFAXINE HCL ER 150 MG PO CP24
150.0000 mg | ORAL_CAPSULE | Freq: Every day | ORAL | Status: DC
  Administered 2019-05-05 – 2019-05-09 (×5): 150 mg via ORAL
  Filled 2019-05-04 (×5): qty 1

## 2019-05-04 MED ORDER — DEXTROMETHORPHAN POLISTIREX ER 30 MG/5ML PO SUER
30.0000 mg | Freq: Two times a day (BID) | ORAL | Status: DC | PRN
  Filled 2019-05-04: qty 5

## 2019-05-04 MED ORDER — ACETAMINOPHEN 10 MG/ML IV SOLN
INTRAVENOUS | Status: AC
  Filled 2019-05-04: qty 100

## 2019-05-04 MED ORDER — ACETAMINOPHEN 325 MG PO TABS
325.0000 mg | ORAL_TABLET | Freq: Four times a day (QID) | ORAL | Status: DC | PRN
  Administered 2019-05-08 – 2019-05-09 (×2): 650 mg via ORAL
  Filled 2019-05-04 (×2): qty 2

## 2019-05-04 MED ORDER — FENTANYL CITRATE (PF) 100 MCG/2ML IJ SOLN
INTRAMUSCULAR | Status: AC
  Administered 2019-05-04: 25 ug via INTRAVENOUS
  Filled 2019-05-04: qty 2

## 2019-05-04 MED ORDER — DM-GUAIFENESIN ER 30-600 MG PO TB12: 1.0000 | ORAL_TABLET | Freq: Two times a day (BID) | ORAL | Status: DC | PRN

## 2019-05-04 MED ORDER — ACETAMINOPHEN 10 MG/ML IV SOLN
INTRAVENOUS | Status: DC | PRN
  Administered 2019-05-04: 1000 mg via INTRAVENOUS

## 2019-05-04 MED ORDER — OXYCODONE HCL 5 MG PO TABS
10.0000 mg | ORAL_TABLET | ORAL | Status: DC | PRN
  Administered 2019-05-04: 10 mg via ORAL
  Administered 2019-05-05: 15 mg via ORAL
  Administered 2019-05-05 (×2): 10 mg via ORAL
  Administered 2019-05-06: 15 mg via ORAL
  Filled 2019-05-04 (×2): qty 3
  Filled 2019-05-04 (×4): qty 2

## 2019-05-04 MED ORDER — ONDANSETRON HCL 4 MG PO TABS: 4.0000 mg | ORAL_TABLET | Freq: Four times a day (QID) | ORAL | Status: DC | PRN

## 2019-05-04 MED ORDER — DIVALPROEX SODIUM 500 MG PO DR TAB
1000.0000 mg | DELAYED_RELEASE_TABLET | Freq: Every day | ORAL | Status: DC
  Administered 2019-05-04 – 2019-05-08 (×5): 1000 mg via ORAL
  Filled 2019-05-04 (×6): qty 2

## 2019-05-04 MED ORDER — DIPHENHYDRAMINE HCL 12.5 MG/5ML PO ELIX: 12.5000 mg | ORAL_SOLUTION | ORAL | Status: DC | PRN

## 2019-05-04 MED ORDER — KETAMINE HCL 50 MG/ML IJ SOLN
INTRAMUSCULAR | Status: AC
  Filled 2019-05-04: qty 10

## 2019-05-04 MED ORDER — TRANEXAMIC ACID 1000 MG/10ML IV SOLN
INTRAVENOUS | Status: AC
  Filled 2019-05-04: qty 10

## 2019-05-04 MED ORDER — FENTANYL CITRATE (PF) 100 MCG/2ML IJ SOLN
25.0000 ug | INTRAMUSCULAR | Status: AC | PRN
  Administered 2019-05-04 (×6): 25 ug via INTRAVENOUS

## 2019-05-04 MED ORDER — ATORVASTATIN CALCIUM 20 MG PO TABS
40.0000 mg | ORAL_TABLET | Freq: Every morning | ORAL | Status: DC
  Administered 2019-05-05 – 2019-05-09 (×5): 40 mg via ORAL
  Filled 2019-05-04 (×5): qty 2

## 2019-05-04 MED ORDER — CEFAZOLIN SODIUM-DEXTROSE 2-4 GM/100ML-% IV SOLN
2.0000 g | Freq: Four times a day (QID) | INTRAVENOUS | Status: AC
  Administered 2019-05-04 – 2019-05-05 (×3): 2 g via INTRAVENOUS
  Filled 2019-05-04 (×3): qty 100

## 2019-05-04 MED ORDER — OXYCODONE HCL 5 MG PO TABS
ORAL_TABLET | ORAL | Status: AC
  Administered 2019-05-04: 5 mg via ORAL
  Filled 2019-05-04: qty 1

## 2019-05-04 MED ORDER — ALBUTEROL SULFATE HFA 108 (90 BASE) MCG/ACT IN AERS: 1.0000 | INHALATION_SPRAY | Freq: Four times a day (QID) | RESPIRATORY_TRACT | Status: DC

## 2019-05-04 MED ORDER — SODIUM CHLORIDE 0.9 % IV SOLN
INTRAVENOUS | Status: DC | PRN
  Administered 2019-05-04: 40 ug/min via INTRAVENOUS

## 2019-05-04 MED ORDER — ACETAMINOPHEN 500 MG PO TABS
1000.0000 mg | ORAL_TABLET | Freq: Four times a day (QID) | ORAL | Status: AC
  Administered 2019-05-04 – 2019-05-05 (×3): 1000 mg via ORAL
  Filled 2019-05-04 (×4): qty 2

## 2019-05-04 MED ORDER — SODIUM CHLORIDE 0.9 % IV SOLN
INTRAVENOUS | Status: DC
  Administered 2019-05-04 – 2019-05-05 (×2): via INTRAVENOUS

## 2019-05-04 MED ORDER — KETOROLAC TROMETHAMINE 15 MG/ML IJ SOLN
INTRAMUSCULAR | Status: AC
  Filled 2019-05-04: qty 1

## 2019-05-04 MED ORDER — ASPIRIN 325 MG PO TABS
325.0000 mg | ORAL_TABLET | ORAL | Status: DC | PRN
  Filled 2019-05-04: qty 1

## 2019-05-04 MED ORDER — ONDANSETRON HCL 4 MG/2ML IJ SOLN
INTRAMUSCULAR | Status: DC | PRN
  Administered 2019-05-04: 4 mg via INTRAVENOUS

## 2019-05-04 MED ORDER — DOCUSATE SODIUM 100 MG PO CAPS
100.0000 mg | ORAL_CAPSULE | Freq: Two times a day (BID) | ORAL | Status: DC
  Administered 2019-05-04 – 2019-05-09 (×10): 100 mg via ORAL
  Filled 2019-05-04 (×10): qty 1

## 2019-05-04 MED ORDER — PHENYLEPHRINE HCL (PRESSORS) 10 MG/ML IV SOLN
INTRAVENOUS | Status: AC
  Filled 2019-05-04: qty 1

## 2019-05-04 MED ORDER — METOCLOPRAMIDE HCL 10 MG PO TABS: 5.0000 mg | ORAL_TABLET | Freq: Three times a day (TID) | ORAL | Status: DC | PRN

## 2019-05-04 MED ORDER — OXYCODONE HCL 5 MG PO TABS
5.0000 mg | ORAL_TABLET | ORAL | Status: DC | PRN
  Administered 2019-05-05 – 2019-05-06 (×2): 10 mg via ORAL
  Administered 2019-05-07 – 2019-05-08 (×3): 5 mg via ORAL
  Filled 2019-05-04 (×2): qty 1
  Filled 2019-05-04 (×2): qty 2
  Filled 2019-05-04: qty 1

## 2019-05-04 MED ORDER — LACTATED RINGERS IV SOLN
INTRAVENOUS | Status: DC
  Administered 2019-05-04: 07:00:00 via INTRAVENOUS

## 2019-05-04 MED ORDER — PHENYLEPHRINE HCL (PRESSORS) 10 MG/ML IV SOLN
INTRAVENOUS | Status: DC | PRN
  Administered 2019-05-04: 100 ug via INTRAVENOUS

## 2019-05-04 MED ORDER — DEXAMETHASONE SODIUM PHOSPHATE 10 MG/ML IJ SOLN
INTRAMUSCULAR | Status: DC | PRN
  Administered 2019-05-04: 10 mg via INTRAVENOUS

## 2019-05-04 MED ORDER — BUPIVACAINE LIPOSOME 1.3 % IJ SUSP
INTRAMUSCULAR | Status: AC
  Filled 2019-05-04: qty 20

## 2019-05-04 MED ORDER — FAMOTIDINE 20 MG PO TABS
ORAL_TABLET | ORAL | Status: AC
  Filled 2019-05-04: qty 1

## 2019-05-04 MED ORDER — GUAIFENESIN ER 600 MG PO TB12: 600.0000 mg | ORAL_TABLET | Freq: Two times a day (BID) | ORAL | Status: DC | PRN

## 2019-05-04 MED ORDER — MIDAZOLAM HCL 2 MG/2ML IJ SOLN
INTRAMUSCULAR | Status: AC
  Filled 2019-05-04: qty 2

## 2019-05-04 MED ORDER — IPRATROPIUM-ALBUTEROL 0.5-2.5 (3) MG/3ML IN SOLN: 3.0000 mL | Freq: Four times a day (QID) | RESPIRATORY_TRACT | Status: DC | PRN

## 2019-05-04 MED ORDER — FUROSEMIDE 20 MG PO TABS: 20.0000 mg | ORAL_TABLET | ORAL | Status: DC | PRN

## 2019-05-04 MED ORDER — FENTANYL CITRATE (PF) 100 MCG/2ML IJ SOLN
25.0000 ug | Freq: Once | INTRAMUSCULAR | Status: AC
  Administered 2019-05-04: 12:00:00 25 ug via INTRAVENOUS

## 2019-05-04 MED ORDER — TRAMADOL HCL 50 MG PO TABS
50.0000 mg | ORAL_TABLET | Freq: Four times a day (QID) | ORAL | Status: DC | PRN
  Administered 2019-05-09: 50 mg via ORAL
  Filled 2019-05-04 (×2): qty 1

## 2019-05-04 MED ORDER — KETOROLAC TROMETHAMINE 15 MG/ML IJ SOLN
7.5000 mg | Freq: Four times a day (QID) | INTRAMUSCULAR | Status: AC
  Administered 2019-05-04 – 2019-05-05 (×4): 7.5 mg via INTRAVENOUS
  Filled 2019-05-04 (×4): qty 1

## 2019-05-04 MED ORDER — METOCLOPRAMIDE HCL 5 MG/ML IJ SOLN: 5.0000 mg | Freq: Three times a day (TID) | INTRAMUSCULAR | Status: DC | PRN

## 2019-05-04 MED ORDER — MAGNESIUM HYDROXIDE 400 MG/5ML PO SUSP
30.0000 mL | Freq: Every day | ORAL | Status: DC | PRN
  Administered 2019-05-06: 30 mL via ORAL
  Filled 2019-05-04: qty 30

## 2019-05-04 MED ORDER — NICOTINE 21 MG/24HR TD PT24
21.0000 mg | MEDICATED_PATCH | Freq: Every day | TRANSDERMAL | Status: DC
  Filled 2019-05-04: qty 1

## 2019-05-04 MED ORDER — BUPIVACAINE HCL (PF) 0.5 % IJ SOLN
INTRAMUSCULAR | Status: AC
  Filled 2019-05-04: qty 10

## 2019-05-04 MED ORDER — PROPOFOL 500 MG/50ML IV EMUL
INTRAVENOUS | Status: AC
  Filled 2019-05-04: qty 50

## 2019-05-04 MED ORDER — LISINOPRIL 5 MG PO TABS
5.0000 mg | ORAL_TABLET | Freq: Every morning | ORAL | Status: DC
  Administered 2019-05-06: 10:00:00 5 mg via ORAL
  Filled 2019-05-04 (×3): qty 1

## 2019-05-04 MED ORDER — BUPIVACAINE-EPINEPHRINE (PF) 0.5% -1:200000 IJ SOLN
INTRAMUSCULAR | Status: DC | PRN
  Administered 2019-05-04: 30 mL via PERINEURAL

## 2019-05-04 MED ORDER — NICOTINE 21 MG/24HR TD PT24
21.0000 mg | MEDICATED_PATCH | Freq: Every day | TRANSDERMAL | Status: DC
  Administered 2019-05-04 – 2019-05-08 (×3): 21 mg via TRANSDERMAL
  Filled 2019-05-04 (×3): qty 1

## 2019-05-04 MED ORDER — ONDANSETRON HCL 4 MG/2ML IJ SOLN: 4.0000 mg | Freq: Four times a day (QID) | INTRAMUSCULAR | Status: DC | PRN

## 2019-05-04 MED ORDER — CEFAZOLIN SODIUM-DEXTROSE 2-4 GM/100ML-% IV SOLN
INTRAVENOUS | Status: AC
  Filled 2019-05-04: qty 100

## 2019-05-04 SURGICAL SUPPLY — 61 items
BANDAGE ELASTIC 6 LF NS (GAUZE/BANDAGES/DRESSINGS) ×3 IMPLANT
BEARING TIBIAL CR 12X71/75MM (Joint) ×1 IMPLANT
BLADE SAW SAG 25X90X1.19 (BLADE) ×3 IMPLANT
BLADE SURG SZ20 CARB STEEL (BLADE) ×3 IMPLANT
CANISTER SUCT 1200ML W/VALVE (MISCELLANEOUS) ×3 IMPLANT
CANISTER SUCT 3000ML PPV (MISCELLANEOUS) ×3 IMPLANT
CEMENT BONE R 1X40 (Cement) ×6 IMPLANT
CEMENT VACUUM MIXING SYSTEM (MISCELLANEOUS) ×3 IMPLANT
CHLORAPREP W/TINT 26 (MISCELLANEOUS) ×3 IMPLANT
COOLER POLAR GLACIER W/PUMP (MISCELLANEOUS) ×3 IMPLANT
COVER MAYO STAND REUSABLE (DRAPES) ×3 IMPLANT
COVER WAND RF STERILE (DRAPES) ×3 IMPLANT
CUFF TOURN SGL QUICK 24 (TOURNIQUET CUFF)
CUFF TOURN SGL QUICK 30 (TOURNIQUET CUFF)
CUFF TRNQT CYL 24X4X16.5-23 (TOURNIQUET CUFF) IMPLANT
CUFF TRNQT CYL 30X4X21-28X (TOURNIQUET CUFF) IMPLANT
DRAPE 3/4 80X56 (DRAPES) ×3 IMPLANT
DRAPE SPLIT 6X30 W/TAPE (DRAPES) ×3 IMPLANT
DRSG OPSITE POSTOP 4X10 (GAUZE/BANDAGES/DRESSINGS) ×3 IMPLANT
DRSG OPSITE POSTOP 4X8 (GAUZE/BANDAGES/DRESSINGS) ×3 IMPLANT
ELECT CAUTERY BLADE 6.4 (BLADE) ×3 IMPLANT
ELECT REM PT RETURN 9FT ADLT (ELECTROSURGICAL) ×3
ELECTRODE REM PT RTRN 9FT ADLT (ELECTROSURGICAL) ×1 IMPLANT
GLOVE BIO SURGEON STRL SZ7.5 (GLOVE) ×12 IMPLANT
GLOVE BIO SURGEON STRL SZ8 (GLOVE) ×12 IMPLANT
GLOVE BIOGEL PI IND STRL 8 (GLOVE) ×1 IMPLANT
GLOVE BIOGEL PI INDICATOR 8 (GLOVE) ×2
GLOVE INDICATOR 8.0 STRL GRN (GLOVE) ×3 IMPLANT
GOWN STRL REUS W/ TWL LRG LVL3 (GOWN DISPOSABLE) ×1 IMPLANT
GOWN STRL REUS W/ TWL XL LVL3 (GOWN DISPOSABLE) ×1 IMPLANT
GOWN STRL REUS W/TWL LRG LVL3 (GOWN DISPOSABLE) ×2
GOWN STRL REUS W/TWL XL LVL3 (GOWN DISPOSABLE) ×2
HOLDER FOLEY CATH W/STRAP (MISCELLANEOUS) ×3 IMPLANT
HOOD PEEL AWAY FLYTE STAYCOOL (MISCELLANEOUS) ×12 IMPLANT
IMMBOLIZER KNEE 19 BLUE UNIV (SOFTGOODS) ×3 IMPLANT
KIT TURNOVER KIT A (KITS) ×3 IMPLANT
KNEE CR FEMORAL RT 65MM (Femur) ×3 IMPLANT
NDL SAFETY ECLIPSE 18X1.5 (NEEDLE) ×2 IMPLANT
NEEDLE HYPO 18GX1.5 SHARP (NEEDLE) ×4
NEEDLE SPNL 20GX3.5 QUINCKE YW (NEEDLE) ×3 IMPLANT
NS IRRIG 1000ML POUR BTL (IV SOLUTION) ×3 IMPLANT
PACK TOTAL KNEE (MISCELLANEOUS) ×3 IMPLANT
PAD WRAPON POLAR KNEE (MISCELLANEOUS) ×1 IMPLANT
PATELLA STD 34X8.5 (Orthopedic Implant) ×3 IMPLANT
PLATE KNEE TIBIAL 71MM FIXED (Plate) ×3 IMPLANT
PULSAVAC PLUS IRRIG FAN TIP (DISPOSABLE) ×3
SOL .9 NS 3000ML IRR  AL (IV SOLUTION) ×2
SOL .9 NS 3000ML IRR UROMATIC (IV SOLUTION) ×1 IMPLANT
STAPLER SKIN PROX 35W (STAPLE) ×3 IMPLANT
SUCTION FRAZIER HANDLE 10FR (MISCELLANEOUS) ×2
SUCTION TUBE FRAZIER 10FR DISP (MISCELLANEOUS) ×1 IMPLANT
SUT VIC AB 0 CT1 36 (SUTURE) ×9 IMPLANT
SUT VIC AB 2-0 CT1 27 (SUTURE) ×6
SUT VIC AB 2-0 CT1 TAPERPNT 27 (SUTURE) ×3 IMPLANT
SYR 10ML LL (SYRINGE) ×3 IMPLANT
SYR 20ML LL LF (SYRINGE) ×3 IMPLANT
SYR 30ML LL (SYRINGE) ×9 IMPLANT
TIBIAL BEARING CR 12X71/75MM (Joint) ×3 IMPLANT
TIP FAN IRRIG PULSAVAC PLUS (DISPOSABLE) ×1 IMPLANT
TRAY FOLEY MTR SLVR 16FR STAT (SET/KITS/TRAYS/PACK) ×3 IMPLANT
WRAPON POLAR PAD KNEE (MISCELLANEOUS) ×3

## 2019-05-04 NOTE — Anesthesia Procedure Notes (Signed)
Spinal  Patient location during procedure: OR Start time: 05/04/2019 8:35 AM End time: 05/04/2019 8:40 AM Staffing Anesthesiologist: Durenda Hurt, MD Resident/CRNA: Eben Burow, CRNA Performed: resident/CRNA  Preanesthetic Checklist Completed: patient identified, site marked, surgical consent, pre-op evaluation, timeout performed, IV checked, risks and benefits discussed and monitors and equipment checked Spinal Block Patient position: sitting Prep: Betadine and site prepped and draped Patient monitoring: heart rate, continuous pulse ox and blood pressure Approach: midline Location: L3-4 Injection technique: single-shot Needle Needle type: Pencan  Needle gauge: 24 G Needle length: 10 cm Assessment Sensory level: T6 Additional Notes Verified expiration on tray. Patient positioned sitting on side of bed with support person standing in front to assist with positioning. Tolerated procedure well with no paresthesias and no pain on injection.

## 2019-05-04 NOTE — Anesthesia Postprocedure Evaluation (Signed)
Anesthesia Post Note  Patient: Elaine Smith  Procedure(s) Performed: RIGHT TOTAL KNEE ARTHROPLASTY (Right )  Patient location during evaluation: PACU Anesthesia Type: Spinal Level of consciousness: awake and alert Pain management: pain level controlled Vital Signs Assessment: post-procedure vital signs reviewed and stable Respiratory status: spontaneous breathing, nonlabored ventilation and respiratory function stable Cardiovascular status: blood pressure returned to baseline and stable Postop Assessment: no apparent nausea or vomiting Anesthetic complications: no     Last Vitals:  Vitals:   05/04/19 1225 05/04/19 1230  BP: 119/72   Pulse: 90 89  Resp: 13 12  Temp:    SpO2: 92% 93%    Last Pain:  Vitals:   05/04/19 1230  TempSrc:   PainSc: [place]

## 2019-05-04 NOTE — Transfer of Care (Signed)
Immediate Anesthesia Transfer of Care Note  Patient: Elaine Smith  Procedure(s) Performed: RIGHT TOTAL KNEE ARTHROPLASTY (Right )  Patient Location: PACU  Anesthesia Type:Spinal  Level of Consciousness: awake, alert , oriented and patient cooperative  Airway & Oxygen Therapy: Patient Spontanous Breathing and Patient connected to nasal cannula oxygen  Post-op Assessment: Report given to RN and Post -op Vital signs reviewed and stable  Post vital signs: Reviewed and stable  Last Vitals:  Vitals Value Taken Time  BP 114/63 05/04/19 1105  Temp    Pulse 90 05/04/19 1109  Resp 19 05/04/19 1109  SpO2 95 % 05/04/19 1109  Vitals shown include unvalidated device data.  Last Pain:  Vitals:   05/04/19 0635  TempSrc: Tympanic  PainSc: 5          Complications: No apparent anesthesia complications

## 2019-05-04 NOTE — Op Note (Signed)
05/04/2019  10:54 AM  Patient:   Elaine Smith  Pre-Op Diagnosis:   Degenerative joint disease, right knee.  Post-Op Diagnosis:   Same  Procedure:   Right TKA using all-cemented Biomet Vanguard system with a 65 mm PCR femur, a 71 mm tibial tray with a 12 mm E-poly insert, and a 34 x 8.5 mm all-poly 3-pegged domed patella.  Surgeon:   Pascal Lux, MD  Assistant:   Cameron Proud, PA-C; Freddie Apley, PA-S  Anesthesia:   Spinal  Findings:   As above  Complications:   None  EBL:   10 cc  Fluids:   500 cc crystalloid  UOP:   75 cc  TT:   100 minutes at 300 mmHg  Drains:   None  Closure:   Staples  Implants:   As above  Brief Clinical Note:   The patient is a 68 year old female with a long history of progressively worsening right knee pain. The patient's symptoms have progressed despite medications, activity modification, injections, etc. The patient's history and examination were consistent with advanced degenerative joint disease of the right knee confirmed by plain radiographs. The patient presents at this time for a right total knee arthroplasty.  Procedure:   The patient was brought into the operating room. After adequate spinal anesthesia was obtained, the patient was lain in the supine position. A Foley catheter was placed by the nurse before the right lower extremity was prepped with ChloraPrep solution and draped sterilely. Preoperative antibiotics were administered. After verifying the proper laterality with a surgical timeout, the limb was exsanguinated with an Esmarch and the tourniquet inflated to 300 mmHg. A standard anterior approach to the knee was made through an approximately 7 inch incision. The incision was carried down through the subcutaneous tissues to expose superficial retinaculum. This was split the length of the incision and the medial flap elevated sufficiently to expose the medial retinaculum. The medial retinaculum was incised, leaving a 3-4 mm cuff of  tissue on the patella. This was extended distally along the medial border of the patellar tendon and proximally through the medial third of the quadriceps tendon. A subtotal fat pad excision was performed before the soft tissues were elevated off the anteromedial and anterolateral aspects of the proximal tibia to the level of the collateral ligaments. The anterior portions of the medial and lateral menisci were removed, as was the anterior cruciate ligament. With the knee flexed to 90, the external tibial guide was positioned and the appropriate proximal tibial cut made. This piece was taken to the back table where it was measured and found to be optimally replicated by a 71 mm component.  Attention was directed to the distal femur. The intramedullary canal was accessed through a 3/8" drill hole. The intramedullary guide was inserted and positioned in order to obtain a neutral flexion gap. The intercondylar block was positioned with care taken to avoid notching the anterior cortex of the femur. The appropriate cut was made. Next, the distal cutting block was placed at 6 of valgus alignment. Using the 9 mm slot, the distal cut was made. The distal femur was measured and found to be optimally replicated by the 65 mm component. The 65 mm 4-in-1 cutting block was positioned and first the posterior, then the posterior chamfer, the anterior chamfer, and finally the anterior cuts were made. At this point, the posterior portions medial and lateral menisci were removed. A trial reduction was performed using the appropriate femoral and tibial components with  first the 10 mm and then the 12 mm insert. The 12 mm insert demonstrated excellent stability to varus and valgus stressing both in flexion and extension while permitting full extension. Patella tracking was assessed and found to be excellent. Therefore, the tibial guide position was marked on the proximal tibia. The patella thickness was measured and found to be 19  mm. Therefore, the appropriate cut was made. The patellar surface was measured and found to be optimally replicated by the 34 mm component. The three peg holes were drilled in place before the trial button was inserted. Patella tracking was assessed and found to be excellent, passing the "no thumb test". The lug holes were drilled into the distal femur before the trial component was removed, leaving only the tibial tray. The keel was then created using the appropriate tower, reamer, and punch.  The bony surfaces were prepared for cementing by irrigating them thoroughly with bacitracin saline solution via the jet lavage system. A bone plug was fashioned from some of the bone that had been removed previously and used to plug the distal femoral canal. In addition, 20 cc of Exparel diluted out to 60 cc with normal saline and 30 cc of 0.5% Sensorcaine were injected into the postero-medial and postero-lateral aspects of the knee, the medial and lateral gutter regions, and the peri-incisional tissues to help with postoperative analgesia. Meanwhile, the cement was being mixed on the back table. When it was ready, the tibial tray was cemented in first. The excess cement was removed using [employer] fast streamer. Next, the femoral component was impacted into place. Again, the excess cement was removed using [employer] fast streamer. The 12 mm trial insert was positioned and the knee brought into extension while the cement hardened. Finally, the patella was cemented into place and secured using the patellar clamp. Again, the excess cement was removed using [employer] fast streamer. Once the cement had hardened, the knee was placed through a range of motion with the findings as described above. Therefore, the trial insert was removed and, after verifying that no cement had been retained posteriorly, the permanent insert was positioned and secured using the appropriate key locking mechanism. Again the knee was placed through a range of motion with the  findings as described above.  The wound was copiously irrigated with bacitracin saline solution using the jet lavage system before the quadriceps tendon and retinacular layer were reapproximated using #0 Vicryl interrupted sutures. The superficial retinacular layer also was closed using a running #0 Vicryl suture. A total of 10 cc of transexemic acid (TXA) was injected intra-articularly before the subcutaneous tissues were closed in several layers using 2-0 Vicryl interrupted sutures. The skin was closed using staples. A sterile honeycomb dressing was applied to the skin before the leg was wrapped with an Ace wrap to accommodate the Polar Care device. The patient was then awakened and returned to the recovery room in satisfactory condition after tolerating the procedure well.

## 2019-05-04 NOTE — Evaluation (Signed)
Physical Therapy Evaluation Patient Details Name: Elaine Smith MRN: [id number] DOB: 08/19/1951 Today's Date: 05/04/2019   History of Present Illness  Pt is a 68 yo F s/p elective R TKA.  PMH includes COPD, CHF, and breast CA.  Clinical Impression  Pt presents with deficits in strength, transfers, mobility, gait, balance, R knee ROM, and activity tolerance.  Pt was lethargic during the session drifting off to sleep several times but improved once pt was up in sitting at the EOB.  The pt did well with bed mobility tasks especially considering POD#0 status requiring no assistance during sup to sit and only min A with sit to sup.  Pt actively participated with the below therex but requested not to try to stand this session secondary to not feeling safe to do so.  Pt was able to laterally scoot at the EOB, however, with only SBA and cues for sequencing.  Based on pt's functional level on POD#0 I anticipate the pt should make good progress while in acute care.  The pt willl benefit from [place] services upon discharge to safely address above deficits for decreased caregiver assistance and eventual return to PLOF.      Follow Up Recommendations Home health PT    Equipment Recommendations  Rolling walker with 5" wheels;3in1 (PT)    Recommendations for Other Services       Precautions / Restrictions Precautions Precautions: Fall Restrictions Weight Bearing Restrictions: Yes RLE Weight Bearing: Weight bearing as tolerated      Mobility  Bed Mobility Overal bed mobility: Needs Assistance Bed Mobility: Supine to Sit;Sit to Supine     Supine to sit: Supervision Sit to supine: Min assist   General bed mobility comments: Extra time and effort required but no physical assistance with sup to sit and min A required for RLE into bed during sit to sup  Transfers                 General transfer comment: Pt declined attempt at standing this session secondary to lethargy but was able to  laterally scoot to her right 1' towards the [facility]  Ambulation/Gait             General Gait Details: Deferred secondary to pt level of alertness  Stairs            Wheelchair Mobility    Modified Rankin (Stroke Patients Only)       Balance Overall balance assessment: Needs assistance Sitting-balance support: Bilateral upper extremity supported;Feet supported Sitting balance-Leahy Scale: Good         Standing balance comment: NT                             Pertinent Vitals/Pain Pain Assessment: 0-10 Pain Score: 2  Pain Location: R knee Pain Descriptors / Indicators: Sore Pain Intervention(s): Monitored during session;Premedicated before session    Home Living Family/patient expects to be discharged to:: Private residence Living Arrangements: Other relatives(Sister and nephew) Available Help at Discharge: Family;Available 24 hours/day Type of Home: House Home Access: Stairs to enter Entrance Stairs-Rails: Left Entrance Stairs-Number of Steps: 3 Home Layout: One level Home Equipment: Cane - single point      Prior Function Level of Independence: Independent with assistive device(s)         Comments: Ind amb in the home without an AD, SPC in the community, no fall history but "unsteady" per pt's daughter, Ind with ADLs  Hand Dominance        Extremity/Trunk Assessment   Upper Extremity Assessment Upper Extremity Assessment: Overall WFL for tasks assessed    Lower Extremity Assessment Lower Extremity Assessment: Generalized weakness;RLE deficits/detail RLE Deficits / Details: Pt able to perform Ind RLE SLR without extensor lag RLE: Unable to fully assess due to pain RLE Sensation: WNL       Communication   Communication: No difficulties  Cognition Arousal/Alertness: Lethargic Behavior During Therapy: WFL for tasks assessed/performed Overall Cognitive Status: Within Functional Limits for tasks assessed                                         General Comments      Exercises Total Joint Exercises Ankle Circles/Pumps: AROM;Both;10 reps;15 reps Quad Sets: Strengthening;AROM;Both;5 reps;10 reps Gluteal Sets: Strengthening;Both;5 reps;10 reps Hip ABduction/ADduction: AAROM;Right;10 reps Straight Leg Raises: AAROM;Right;10 reps Long Arc Quad: AROM;Both;10 reps;15 reps Knee Flexion: AROM;Both;10 reps;15 reps Goniometric ROM: R knee AROM: 2-85 deg Other Exercises Other Exercises: Positioning education with pt and daughter to promote R knee ext PROM Other Exercises: Pt and daughter education on polar care management Other Exercises: HEP education with pt and daughter for BLE APs, QS, GS, and LAQ x 10 each 5x/day   Assessment/Plan    PT Assessment Patient needs continued PT services  PT Problem List Decreased strength;Decreased range of motion;Decreased mobility;Decreased knowledge of use of DME       PT Treatment Interventions DME instruction;Gait training;Stair training;Functional mobility training;Therapeutic activities;Therapeutic exercise;Balance training;Patient/family education    PT Goals (Current goals can be found in the Care Plan section)  Acute Rehab PT Goals Patient Stated Goal: "To get stronger and walk normal" PT Goal Formulation: With patient Time For Goal Achievement: 05/17/19 Potential to Achieve Goals: Good    Frequency BID   Barriers to discharge        Co-evaluation               AM-PAC PT "6 Clicks" Mobility  Outcome Measure Help needed turning from your back to your side while in a flat bed without using bedrails?: A Little Help needed moving from lying on your back to sitting on the side of a flat bed without using bedrails?: A Little Help needed moving to and from a bed to a chair (including a wheelchair)?: A Little Help needed standing up from a chair using your arms (e.g., wheelchair or bedside chair)?: A Little Help needed to walk in hospital  room?: A Lot Help needed climbing 3-5 steps with a railing? : A Lot 6 Click Score: 16    End of Session Equipment Utilized During Treatment: Gait belt Activity Tolerance: Patient tolerated treatment well Patient left: in bed;with call bell/phone within reach;with bed alarm set;with family/visitor present;with SCD's reapplied;Other (comment)(Polar care donned to R knee) Nurse Communication: Mobility status PT Visit Diagnosis: Other abnormalities of gait and mobility (R26.89);Muscle weakness (generalized) (M62.81)    Time: 1420-1501 PT Time Calculation (min) (ACUTE ONLY): 41 min   Charges:   PT Evaluation $PT Eval Moderate Complexity: 1 Mod PT Treatments $Therapeutic Exercise: 8-22 mins $Therapeutic Activity: 8-22 mins        D. Scott Dawaun Brancato PT, DPT 05/04/19, 5:20 PM

## 2019-05-04 NOTE — H&P (Signed)
Paper H&P to be scanned into permanent record. H&P reviewed and patient re-examined. No changes. 

## 2019-05-04 NOTE — Anesthesia Preprocedure Evaluation (Addendum)
Anesthesia Evaluation  Patient identified by MRN, date of birth, ID band Patient awake    Reviewed: Allergy & Precautions, H&P , NPO status , Patient's Chart, lab work & pertinent test results  History of Anesthesia Complications (+) Family history of anesthesia reaction and history of anesthetic complications ("hard to put to sleep and slow to wake up."  Multiple family members also reportedly woke up during anesthesia)  Airway Mallampati: II  TM Distance: >3 FB     Dental  (+) Chipped, Missing, Loose Multiple loose teeth:   Pulmonary shortness of breath, asthma , sleep apnea (BiPap) , COPD, former smoker,  Reports increased cough since quitting smoking 2 weeks ago          Cardiovascular Exercise Tolerance: Poor +CHF  (-) Past MI and (-) Cardiac Stents (-) dysrhythmias   Cardiac MRI 02/18/18: 1. The left ventricle is mildly dilated in cavity size. There is mild concentric LV hypertrophy. Global systolic function is mildly reduced with an LV ejection fraction calculated at 48%. There is global mild hypokinesis.  2. The right ventricle is normal in cavity size, wall thickness, and systolic function.  3. Both atria are mildly enlarged.  4. The aortic valve is trileaflet in morphology without any significant stenosis. Peak aortic flow velocity is 1.8 m/s. There is mild-moderate aortic regurgitation.  5. There is mild pulmonic regurgitation and trivial mitral and tricuspid regurgitation.   6. Delayed enhancement imaging demonstrates no evidence of myocardial infarction, scar or infiltrative disease.   7. Regadenoson stress perfusion imaging demonstrates no evidence of inducible myocardial ischemia.   8. No intracardiac thrombus visualized.   CONCLUSION: Non-ischemic dilated cardiomyopathy.   Neuro/Psych negative neurological ROS  negative psych ROS   GI/Hepatic negative GI ROS, Neg liver ROS,   Endo/Other  negative  endocrine ROS  Renal/GU      Musculoskeletal  (+) Arthritis ,   Abdominal   Peds  Hematology negative hematology ROS (+)   Anesthesia Other Findings Obese  Past Medical History: No date: Arthritis No date: Cancer ([place])     Comment:  BREAST CA No date: CHF (congestive heart failure) (HCC) No date: Complication of anesthesia     Comment:  TAKES ALOT TO GET PT SEDATED AND THEN HARD TO WAKE               UP-WOKE UP DURING BREAST BIOPSY No date: COPD (chronic obstructive pulmonary disease) (HCC) No date: Dyspnea     Comment:  WITH EXERTION No date: Family history of adverse reaction to anesthesia     Comment:  MOM-TOOK ALOT TO PUT HER TO SLEEP AND HARD TO WAKE UP No date: Sleep apnea     Comment:  USES BIPAP  Past Surgical History: No date: ABDOMINAL HYSTERECTOMY No date: BREAST SURGERY     Comment:  BIOPSY No date: COLONOSCOPY No date: FRACTURE SURGERY; Right     Comment:  LEG 2000: MASTECTOMY; Bilateral  BMI    Body Mass Index: 33.99 kg/m      Reproductive/Obstetrics negative OB ROS                            Anesthesia Physical Anesthesia Plan  ASA: III  Anesthesia Plan: Spinal   Post-op Pain Management:    Induction:   PONV Risk Score and Plan:   Airway Management Planned:   Additional Equipment:   Intra-op Plan:   Post-operative Plan:   Informed Consent: I have reviewed  the patients History and Physical, chart, labs and discussed the procedure including the risks, benefits and alternatives for the proposed anesthesia with the patient or authorized representative who has indicated his/her understanding and acceptance.     Dental Advisory Given  Plan Discussed with: Anesthesiologist and CRNA  Anesthesia Plan Comments:         Anesthesia Quick Evaluation

## 2019-05-04 NOTE — Anesthesia Post-op Follow-up Note (Signed)
Anesthesia QCDR form completed.        

## 2019-05-05 ENCOUNTER — Encounter: Payer: Self-pay | Admitting: Surgery

## 2019-05-05 ENCOUNTER — Inpatient Hospital Stay: Payer: Medicare Other

## 2019-05-05 LAB — BASIC METABOLIC PANEL
Anion gap: 7 (ref 5–15)
BUN: 27 mg/dL — ABNORMAL HIGH (ref 8–23)
CO2: 27 mmol/L (ref 22–32)
Calcium: 8.3 mg/dL — ABNORMAL LOW (ref 8.9–10.3)
Chloride: 104 mmol/L (ref 98–111)
Creatinine, Ser: 1.16 mg/dL — ABNORMAL HIGH (ref 0.44–1.00)
GFR calc Af Amer: 56 mL/min — ABNORMAL LOW (ref >60)
GFR calc non Af Amer: 48 mL/min — ABNORMAL LOW (ref >60)
Glucose, Bld: 104 mg/dL — ABNORMAL HIGH (ref 70–99)
Potassium: 4.9 mmol/L (ref 3.5–5.1)
Sodium: 138 mmol/L (ref 135–145)

## 2019-05-05 LAB — CBC WITH DIFFERENTIAL/PLATELET
Abs Immature Granulocytes: 0.04 10*3/uL (ref 0.00–0.07)
Basophils Absolute: 0 10*3/uL (ref 0.0–0.1)
Basophils Relative: 0 %
Eosinophils Absolute: 0 10*3/uL (ref 0.0–0.5)
Eosinophils Relative: 0 %
HCT: 37.2 % (ref 36.0–46.0)
Hemoglobin: 11.4 g/dL — ABNORMAL LOW (ref 12.0–15.0)
Immature Granulocytes: 0 %
Lymphocytes Relative: 19 %
Lymphs Abs: 1.9 10*3/uL (ref 0.7–4.0)
MCH: 31.1 pg (ref 26.0–34.0)
MCHC: 30.6 g/dL (ref 30.0–36.0)
MCV: 101.4 fL — ABNORMAL HIGH (ref 80.0–100.0)
Monocytes Absolute: 0.8 10*3/uL (ref 0.1–1.0)
Monocytes Relative: 8 %
Neutro Abs: 7.4 10*3/uL (ref 1.7–7.7)
Neutrophils Relative %: 73 %
Platelets: 161 10*3/uL (ref 150–400)
RBC: 3.67 MIL/uL — ABNORMAL LOW (ref 3.87–5.11)
RDW: 12.8 % (ref 11.5–15.5)
WBC: 10.2 10*3/uL (ref 4.0–10.5)
nRBC: 0 % (ref 0.0–0.2)

## 2019-05-05 MED ORDER — OXYCODONE HCL 5 MG PO TABS: 5.0000 mg | ORAL_TABLET | ORAL | 0 refills | Status: AC | PRN

## 2019-05-05 MED ORDER — ENOXAPARIN SODIUM 40 MG/0.4ML ~~LOC~~ SOLN: 40.0000 mg | SUBCUTANEOUS | 0 refills | Status: AC

## 2019-05-05 MED ORDER — TRAMADOL HCL 50 MG PO TABS: 50.0000 mg | ORAL_TABLET | Freq: Four times a day (QID) | ORAL | 0 refills | Status: AC | PRN

## 2019-05-05 MED ORDER — SODIUM CHLORIDE 0.9 % IV BOLUS
250.0000 mL | Freq: Once | INTRAVENOUS | Status: AC
  Administered 2019-05-05: 250 mL via INTRAVENOUS

## 2019-05-05 NOTE — Discharge Summary (Deleted)
Physician Discharge Summary  Patient ID: ALEXXUS SOBH MRN: [id number] DOB/AGE: 1950-12-29 68 y.o.  Admit date: 05/04/2019 Discharge date: 05/05/2019  Admission Diagnoses:  PRIMARY OSTEOARTHRITIS OF RIGHT KNEE  Discharge Diagnoses: Patient Active Problem List   Diagnosis Date Noted  . Status post total knee replacement using cement, right 05/04/2019    Past Medical History:  Diagnosis Date  . Arthritis   . Cancer ([place])    BREAST CA  . CHF (congestive heart failure) ([place])   . Complication of anesthesia    TAKES ALOT TO GET PT SEDATED AND THEN HARD TO WAKE UP-WOKE UP DURING BREAST BIOPSY  . COPD (chronic obstructive pulmonary disease) ([place])   . Dyspnea    WITH EXERTION  . Family history of adverse reaction to anesthesia    MOM-TOOK ALOT TO PUT HER TO SLEEP AND HARD TO WAKE UP  . Sleep apnea    USES BIPAP     Transfusion: None.   Consultants (if any):   Discharged Condition: Improved  Hospital Course: Elaine Smith is an 68 y.o. female who was admitted 05/04/2019 with a diagnosis of primary osteoarthritis of the right knee and went to the operating room on 05/04/2019 and underwent the above named procedures.    Surgeries: Procedure(s): RIGHT TOTAL KNEE ARTHROPLASTY on 05/04/2019 Patient tolerated the surgery well. Taken to PACU where she was stabilized and then transferred to the orthopedic floor.  Started on Lovenox 40mg  q 24 hrs. Foot pumps applied bilaterally at 80 mm. Heels elevated on bed with rolled towels. No evidence of DVT. Negative [place]. Physical therapy started on day #1 for gait training and transfer. OT started day #1 for ADL and assisted devices.  Patient's Foley was removed on POD1, IV was removed on POD2.  Implants: Right TKA using all-cemented Biomet Vanguard system with a 65 mm PCR femur, a 71 mm tibial tray with a 12 mm E-poly insert, and a 34 x 8.5 mm all-poly 3-pegged domed patella.  She was given perioperative antibiotics:  Anti-infectives (From  admission, onward)   Start     Dose/Rate Route Frequency Ordered Stop   05/04/19 1400  ceFAZolin (ANCEF) IVPB 2g/100 mL premix     2 g 200 mL/hr over 30 Minutes Intravenous Every 6 hours 05/04/19 1248 05/05/19 0234   05/04/19 0700  ceFAZolin (ANCEF) IVPB 2g/100 mL premix     2 g 200 mL/hr over 30 Minutes Intravenous  Once 05/04/19 0627 05/04/19 0852   05/04/19 0631  ceFAZolin (ANCEF) 2-4 GM/100ML-% IVPB    Note to Pharmacy: Milinda Cave   : cabinet override      05/04/19 0631 05/04/19 0847    .  She was given sequential compression devices, early ambulation, and Lovenox for DVT prophylaxis.  She benefited maximally from the hospital stay and there were no complications.    Recent vital signs:  Vitals:   05/05/19 0505 05/05/19 0640  BP: (!) 103/41 (!) 104/49  Pulse: 80 76  Resp:    Temp:    SpO2: 96%     Recent laboratory studies:  Lab Results  Component Value Date   HGB 11.4 (L) 05/05/2019   HGB 14.0 04/27/2019   Lab Results  Component Value Date   WBC 10.2 05/05/2019   PLT 161 05/05/2019   Lab Results  Component Value Date   INR 0.9 04/27/2019   Lab Results  Component Value Date   NA 138 05/05/2019   K 4.9 05/05/2019   CL 104 05/05/2019  CO2 27 05/05/2019   BUN 27 (H) 05/05/2019   CREATININE 1.16 (H) 05/05/2019   GLUCOSE 104 (H) 05/05/2019    Discharge Medications:   Allergies as of 05/05/2019   No Known Allergies     Medication List    TAKE these medications   atorvastatin 40 MG tablet Commonly known as: LIPITOR Take 40 mg by mouth every morning.   buPROPion 300 MG 24 hr tablet Commonly known as: WELLBUTRIN XL Take 300 mg by mouth every morning.   COMBIVENT IN Inhale 1 spray into the lungs 4 (four) times daily.   ipratropium-albuterol 0.5-2.5 (3) MG/3ML Soln Commonly known as: DUONEB Take 3 mLs by nebulization as needed.   divalproex 500 MG DR tablet Commonly known as: DEPAKOTE Take 1,000 mg by mouth at bedtime.   enoxaparin 40  MG/0.4ML injection Commonly known as: LOVENOX Inject 0.4 mLs (40 mg total) into the skin daily.   furosemide 20 MG tablet Commonly known as: LASIX Take 20 mg by mouth as needed for fluid or edema.   lisinopril 5 MG tablet Commonly known as: ZESTRIL Take 5 mg by mouth every morning.   meloxicam 7.5 MG tablet Commonly known as: MOBIC Take 7.5 mg by mouth as needed for pain.   nicotine 21 mg/24hr patch Commonly known as: NICODERM CQ - dosed in mg/24 hours Place 21 mg onto the skin daily.   OVER THE COUNTER MEDICATION Take 2 capsules by mouth every morning. STACKER 3   oxyCODONE 5 MG immediate release tablet Commonly known as: Oxy IR/ROXICODONE Take 1-2 tablets (5-10 mg total) by mouth every 4 (four) hours as needed for moderate pain.   potassium chloride SA 20 MEQ tablet Commonly known as: K-DUR Take 20 mEq by mouth 2 (two) times daily.   traMADol 50 MG tablet Commonly known as: ULTRAM Take 1-2 tablets (50-100 mg total) by mouth every 6 (six) hours as needed for moderate pain. What changed:   how much to take  reasons to take this   traZODone 100 MG tablet Commonly known as: DESYREL Take 250-300 mg by mouth at bedtime.   venlafaxine XR 150 MG 24 hr capsule Commonly known as: EFFEXOR-XR Take 150 mg by mouth daily with breakfast.            Durable Medical Equipment  (From admission, onward)         Start     Ordered   05/04/19 1249  DME Bedside commode  Once    Question:  Patient needs a bedside commode to treat with the following condition  Answer:  Status post total knee replacement using cement, right   05/04/19 1248   05/04/19 1249  DME 3 n 1  Once     05/04/19 1248   05/04/19 1249  DME Walker rolling  Once    Question:  Patient needs a walker to treat with the following condition  Answer:  Status post total knee replacement using cement, right   05/04/19 1248         Diagnostic Studies: Dg Knee Right Port  Result Date: 05/04/2019 CLINICAL DATA:   Total knee arthroplasty. EXAM: PORTABLE RIGHT KNEE - 1-2 VIEW COMPARISON:  None. FINDINGS: Total knee arthroplasty. Components well positioned. No radiographically detectable complication. Fluid and air in the joint as expected. IMPRESSION: Good appearance following total knee arthroplasty. Electronically Signed   By: Nelson Chimes M.D.   On: 05/04/2019 11:33   Disposition: Plan for discharge home on 05/05/19 pending progress with PT.  Follow-up Information    Lattie Corns, PA-C Follow up in 14 day(s).   Specialty: Physician Assistant Why: Electa Sniff information: [place] [address] [phone]          Signed: Judson Roch PA-C 05/05/2019, 8:09 AM

## 2019-05-05 NOTE — NC FL2 (Signed)
[place] LEVEL OF CARE SCREENING TOOL     IDENTIFICATION  Patient Name: Elaine Smith Birthdate: Aug 26, 1951 Sex: female Admission Date (Current Location): 05/04/2019  [place] and [place] Number:  Engineering geologist and Address:  [facility], [address] [place], [place] [address]      Provider Number: [id number]  Attending Physician Name and Address:  Corky Mull, MD  Relative Name and Phone Number:  Rojelio Brenner [phone]    Current Level of Care: Hospital Recommended Level of Care: [place] Prior Approval Number:    Date Approved/Denied:   PASRR Number: [id number]  Discharge Plan: SNF    Current Diagnoses: Patient Active Problem List   Diagnosis Date Noted  . Status post total knee replacement using cement, right 05/04/2019    Orientation RESPIRATION BLADDER Height & Weight     Self, Time, Situation, Place  Normal Continent Weight: 89.8 kg Height:  5\' 4"  (162.6 cm)  BEHAVIORAL SYMPTOMS/MOOD NEUROLOGICAL BOWEL NUTRITION STATUS      Continent Diet  AMBULATORY STATUS COMMUNICATION OF NEEDS Skin   Extensive Assist Verbally Normal, Surgical wounds                       Personal Care Assistance Level of Assistance  Bathing, Dressing Bathing Assistance: Limited assistance   Dressing Assistance: Limited assistance     Functional Limitations Info  Sight, Hearing, Speech Sight Info: Adequate Hearing Info: Adequate Speech Info: Adequate    SPECIAL CARE FACTORS FREQUENCY  PT (By licensed PT)     PT Frequency: 5 times per week              Contractures Contractures Info: Not present    Additional Factors Info  Code Status, Allergies Code Status Info: full Allergies Info: nkda           Current Medications (05/05/2019):  This is the current hospital active medication list Current Facility-Administered Medications  Medication Dose Route Frequency Provider Last Rate Last Dose  . 0.9  %  sodium chloride infusion   Intravenous Continuous Poggi, Marshall Cork, MD   Stopped at 05/05/19 1239  . acetaminophen (TYLENOL) tablet 1,000 mg  1,000 mg Oral Q6H Poggi, Marshall Cork, MD   1,000 mg at 05/05/19 0900  . acetaminophen (TYLENOL) tablet 325-650 mg  325-650 mg Oral Q6H PRN Poggi, Marshall Cork, MD      . albuterol (PROVENTIL) (2.5 MG/3ML) 0.083% nebulizer solution 2.5 mg  2.5 mg Nebulization Q6H PRN Poggi, Marshall Cork, MD      . aspirin tablet 325 mg  325 mg Oral PRN Poggi, Marshall Cork, MD      . atorvastatin (LIPITOR) tablet 40 mg  40 mg Oral q morning - 10a Poggi, Marshall Cork, MD   40 mg at 05/05/19 0900  . bisacodyl (DULCOLAX) suppository 10 mg  10 mg Rectal Daily PRN Poggi, Marshall Cork, MD      . buPROPion (WELLBUTRIN XL) 24 hr tablet 300 mg  300 mg Oral q morning - 10a Poggi, Marshall Cork, MD   300 mg at 05/05/19 0900  . dextromethorphan (DELSYM) 30 MG/5ML liquid 30 mg  30 mg Oral BID PRN Poggi, Marshall Cork, MD       And  . guaiFENesin (MUCINEX) 12 hr tablet 600 mg  600 mg Oral BID PRN Poggi, Marshall Cork, MD      . diphenhydrAMINE (BENADRYL) 12.5 MG/5ML elixir 12.5-25 mg  12.5-25 mg Oral Q4H PRN  Poggi, Marshall Cork, MD      . divalproex (DEPAKOTE) DR tablet 1,000 mg  1,000 mg Oral QHS Poggi, Marshall Cork, MD   1,000 mg at 05/04/19 2253  . docusate sodium (COLACE) capsule 100 mg  100 mg Oral BID Poggi, Marshall Cork, MD   100 mg at 05/05/19 0900  . enoxaparin (LOVENOX) injection 40 mg  40 mg Subcutaneous Q24H Poggi, Marshall Cork, MD   40 mg at 05/05/19 0901  . furosemide (LASIX) tablet 20 mg  20 mg Oral PRN Poggi, Marshall Cork, MD      . HYDROmorphone (DILAUDID) injection 0.5-1 mg  0.5-1 mg Intravenous Q4H PRN Poggi, Marshall Cork, MD   1 mg at 05/04/19 1304  . ipratropium-albuterol (DUONEB) 0.5-2.5 (3) MG/3ML nebulizer solution 3 mL  3 mL Nebulization Q6H PRN Poggi, Marshall Cork, MD      . lisinopril (ZESTRIL) tablet 5 mg  5 mg Oral q morning - 10a Poggi, Marshall Cork, MD   Stopped at 05/04/19 1415  . magnesium hydroxide (MILK OF MAGNESIA) suspension 30 mL  30 mL Oral Daily PRN  Poggi, Marshall Cork, MD      . MEDLINE mouth rinse  15 mL Mouth Rinse BID Poggi, Marshall Cork, MD   15 mL at 05/05/19 1000  . metoCLOPramide (REGLAN) tablet 5-10 mg  5-10 mg Oral Q8H PRN Poggi, Marshall Cork, MD       Or  . metoCLOPramide (REGLAN) injection 5-10 mg  5-10 mg Intravenous Q8H PRN Poggi, Marshall Cork, MD      . nicotine (NICODERM CQ - dosed in mg/24 hours) patch 21 mg  21 mg Transdermal Daily Leim Fabry, MD   21 mg at 05/04/19 2321  . ondansetron (ZOFRAN) tablet 4 mg  4 mg Oral Q6H PRN Poggi, Marshall Cork, MD       Or  . ondansetron (ZOFRAN) injection 4 mg  4 mg Intravenous Q6H PRN Poggi, Marshall Cork, MD      . oxyCODONE (Oxy IR/ROXICODONE) immediate release tablet 10-15 mg  10-15 mg Oral Q4H PRN Poggi, Marshall Cork, MD   10 mg at 05/05/19 0901  . oxyCODONE (Oxy IR/ROXICODONE) immediate release tablet 5-10 mg  5-10 mg Oral Q4H PRN Poggi, Marshall Cork, MD      . potassium chloride SA (K-DUR) CR tablet 20 mEq  20 mEq Oral BID Poggi, Marshall Cork, MD   20 mEq at 05/05/19 0901  . sodium phosphate (FLEET) 7-19 GM/118ML enema 1 enema  1 enema Rectal Once PRN Poggi, Marshall Cork, MD      . traMADol Veatrice Bourbon) tablet 50 mg  50 mg Oral Q6H PRN Poggi, Marshall Cork, MD      . traZODone (DESYREL) tablet 100 mg  100 mg Oral QHS Corky Mull, MD   100 mg at 05/04/19 2252  . venlafaxine XR (EFFEXOR-XR) 24 hr capsule 150 mg  150 mg Oral Q breakfast Poggi, Marshall Cork, MD   150 mg at 05/05/19 3149     Discharge Medications: Please see discharge summary for a list of discharge medications.  Relevant Imaging Results:  Relevant Lab Results:   Additional Information [id number]  Su Hilt, RN

## 2019-05-05 NOTE — Progress Notes (Signed)
Subjective: 1 Day Post-Op Procedure(s) (LRB): RIGHT TOTAL KNEE ARTHROPLASTY (Right) Patient reports pain as mild.   Patient is well but was hypotensive this AM, receiving fluid bolus PT and care management to assist with discharge planning. Negative for chest pain and shortness of breath Fever: no Gastrointestinal:Negative for nausea and vomiting  Objective: Vital signs in last 24 hours: Temp:  [97.3 F (36.3 C)-99 F (37.2 C)] 98 F (36.7 C) (08/06 0439) Pulse Rate:  [68-103] 76 (08/06 0640) Resp:  [11-21] 18 (08/06 0439) BP: (94-137)/(41-83) 104/49 (08/06 0640) SpO2:  [89 %-100 %] 96 % (08/06 0505)  Intake/Output from previous day:  Intake/Output Summary (Last 24 hours) at 05/05/2019 0800 Last data filed at 05/05/2019 0600 Gross per 24 hour  Intake 2593.05 ml  Output 635 ml  Net 1958.05 ml    Intake/Output this shift: No intake/output data recorded.  Labs: Recent Labs    05/05/19 0429  HGB 11.4*   Recent Labs    05/05/19 0429  WBC 10.2  RBC 3.67*  HCT 37.2  PLT 161   Recent Labs    05/05/19 0429  NA 138  K 4.9  CL 104  CO2 27  BUN 27*  CREATININE 1.16*  GLUCOSE 104*  CALCIUM 8.3*   No results for input(s): LABPT, INR in the last 72 hours.   EXAM General - Patient is Alert, Appropriate and Oriented Extremity - ABD soft Neurovascular intact Sensation intact distally Intact pulses distally Dorsiflexion/Plantar flexion intact Incision: scant drainage No cellulitis present  Negative Homan's to the right leg. Dressing/Incision - blood tinged drainage Motor Function - intact, moving foot and toes well on exam.    Past Medical History:  Diagnosis Date  . Arthritis   . Cancer ([place])    BREAST CA  . CHF (congestive heart failure) ([place])   . Complication of anesthesia    TAKES ALOT TO GET PT SEDATED AND THEN HARD TO WAKE UP-WOKE UP DURING BREAST BIOPSY  . COPD (chronic obstructive pulmonary disease) ([place])   . Dyspnea    WITH EXERTION  . Family  history of adverse reaction to anesthesia    MOM-TOOK ALOT TO PUT HER TO SLEEP AND HARD TO WAKE UP  . Sleep apnea    USES BIPAP    Assessment/Plan: 1 Day Post-Op Procedure(s) (LRB): RIGHT TOTAL KNEE ARTHROPLASTY (Right) Active Problems:   Status post total knee replacement using cement, right  Estimated body mass index is 33.99 kg/m as calculated from the following:   Height as of this encounter: 5\' 4"  (1.626 m).   Weight as of this encounter: 89.8 kg. Advance diet Up with therapy   BP 104/49.  IV fluid bolus ordered.  Hold BP meds. Nursing note reports confusion this morning, appears improved. Up with therapy today.  Begin working on BM. Possible d/c home tomorrow pending progress with therapy today.  DVT Prophylaxis - Lovenox, Foot Pumps and TED hose Weight-Bearing as tolerated to right leg  J. Cameron Proud, PA-C [facility] 05/05/2019, 8:00 AM

## 2019-05-05 NOTE — Discharge Instructions (Signed)
Diet: As you were doing prior to hospitalization   Shower:  May shower but keep the wounds dry, use an occlusive plastic wrap, NO SOAKING IN TUB.  If the bandage gets wet, change with a clean dry gauze.  Dressing:  You may change your dressing as needed. Change the dressing with sterile gauze dressing.    Activity:  Increase activity slowly as tolerated, but follow the weight bearing instructions below.  No lifting or driving for 6 weeks.  Weight Bearing:   Weightbear as tolerated to the right leg.  To prevent constipation: you may use a stool softener such as -  Colace (over the counter) 100 mg by mouth twice a day  Drink plenty of fluids (prune juice may be helpful) and high fiber foods Miralax (over the counter) for constipation as needed.    Itching:  If you experience itching with your medications, try taking only a single pain pill, or even half a pain pill at a time.  You may take up to 10 pain pills per day, and you can also use benadryl over the counter for itching or also to help with sleep.   Precautions:  If you experience chest pain or shortness of breath - call 911 immediately for transfer to the hospital emergency department!!  If you develop a fever greater that 101 F, purulent drainage from wound, increased redness or drainage from wound, or calf pain-Call [facility]                                              Follow- Up Appointment:  Please call for an appointment to be seen in 2 weeks at [facility]

## 2019-05-05 NOTE — Progress Notes (Signed)
Physical Therapy Treatment Patient Details Name: Elaine Smith MRN: [id number] DOB: [birth year] Today's Date: 05/05/2019    History of Present Illness Pt is a 68 yo F s/p elective R TKA.  PMH includes COPD, CHF, and breast CA.    PT Comments    Pt presents with deficits in strength, transfers, mobility, gait, balance, R knee ROM, and activity tolerance.  Pt required min A with bed mobility tasks for RLE management.  Pt required min A with transfers with cues for hand placement and increased trunk flexion.  Pt was able to take several steps at the EOB with heavy lean on the RW and antalgic on the RLE before feeling "dizzy" and requiring to return to sitting.  Pt's BP taken in sitting at 105/54 mmHg with SpO2 and HR WNL, nursing notified.  Pt will benefit from HHPT services upon discharge to safely address above deficits for decreased caregiver assistance and eventual return to PLOF.     Follow Up Recommendations  Home health PT;Other (comment)(Following closely for possible change to SNF)     Equipment Recommendations  Rolling walker with 5" wheels;3in1 (PT)    Recommendations for Other Services       Precautions / Restrictions Precautions Precautions: Fall Restrictions Weight Bearing Restrictions: Yes RLE Weight Bearing: Weight bearing as tolerated    Mobility  Bed Mobility Overal bed mobility: Needs Assistance Bed Mobility: Supine to Sit;Sit to Supine     Supine to sit: Min assist Sit to supine: Min assist   General bed mobility comments: Min A for RLE in/out of bed  Transfers Overall transfer level: Needs assistance Equipment used: Rolling walker (2 wheeled) Transfers: Sit to/from Stand Sit to Stand: Min assist         General transfer comment: Min verbal cues for hand placement and increased trunk flexion  Ambulation/Gait Ambulation/Gait assistance: Min guard Gait Distance (Feet): 3 Feet Assistive device: Rolling walker (2 wheeled) Gait Pattern/deviations:  Antalgic;Decreased stance time - right;Step-to pattern;Trunk flexed Gait velocity: decreased   General Gait Details: Heavy lean on the RW and antalgic on the RLE with pt only able to amb 3' before feeling "dizzy" and returning to sitting.  BP taken at 105/54 mmHg with HR and SpO2 WNL.   Stairs             Wheelchair Mobility    Modified Rankin (Stroke Patients Only)       Balance Overall balance assessment: Needs assistance Sitting-balance support: Bilateral upper extremity supported;Feet supported Sitting balance-Leahy Scale: Good     Standing balance support: Bilateral upper extremity supported Standing balance-Leahy Scale: Fair                              Cognition Arousal/Alertness: Awake/alert Behavior During Therapy: WFL for tasks assessed/performed Overall Cognitive Status: Within Functional Limits for tasks assessed                                        Exercises Total Joint Exercises Ankle Circles/Pumps: Strengthening;Both;5 reps;10 reps Quad Sets: Strengthening;AROM;Both;5 reps;10 reps Gluteal Sets: Strengthening;Both;5 reps;10 reps Short Arc Quad: Strengthening;Both;10 reps Hip ABduction/ADduction: AAROM;Right;10 reps;5 reps Straight Leg Raises: AAROM;Right;10 reps;5 reps Long Arc Quad: AROM;Both;10 reps;15 reps Knee Flexion: AROM;Both;10 reps;15 reps Goniometric ROM: R knee AROM: 3-82 deg, limited by pain Marching in Standing: AROM;Both;5 reps;Standing Other Exercises Other Exercises: Positioning  education/review to promote R knee ext PROM Other Exercises: HEP education/review per handout    General Comments        Pertinent Vitals/Pain Pain Assessment: 0-10 Pain Score: 2  Pain Location: R knee Pain Descriptors / Indicators: Sore Pain Intervention(s): Premedicated before session;Monitored during session    Home Living                      Prior Function            PT Goals (current goals can now  be found in the care plan section) Progress towards PT goals: Progressing toward goals    Frequency    BID      PT Plan Current plan remains appropriate    Co-evaluation              AM-PAC PT "6 Clicks" Mobility   Outcome Measure  Help needed turning from your back to your side while in a flat bed without using bedrails?: A Little Help needed moving from lying on your back to sitting on the side of a flat bed without using bedrails?: A Little Help needed moving to and from a bed to a chair (including a wheelchair)?: A Little Help needed standing up from a chair using your arms (e.g., wheelchair or bedside chair)?: A Little Help needed to walk in hospital room?: A Lot Help needed climbing 3-5 steps with a railing? : A Lot 6 Click Score: 16    End of Session Equipment Utilized During Treatment: Gait belt Activity Tolerance: Other (comment)(Pt limited by dizziness in standing) Patient left: in bed;with call bell/phone within reach;with bed alarm set;with family/visitor present;with SCD's reapplied;Other (comment)(Polar care donned) Nurse Communication: Mobility status;Other (comment)(Pt c/o dizziness in standing with BP 105/54 mmHg) PT Visit Diagnosis: Other abnormalities of gait and mobility (R26.89);Muscle weakness (generalized) (M62.81)     Time: 1093-2355 PT Time Calculation (min) (ACUTE ONLY): 43 min  Charges:  $Therapeutic Exercise: 23-37 mins $Therapeutic Activity: 8-22 mins                     D. Scott Jarek Longton PT, DPT 05/05/19, 1:27 PM

## 2019-05-05 NOTE — Discharge Summary (Addendum)
Physician Discharge Summary  Patient ID: Elaine Smith MRN: [id number] DOB/AGE: [id number] y.o.  Admit date: 05/04/2019 Discharge date: 05/08/19  Admission Diagnoses:  PRIMARY OSTEOARTHRITIS OF RIGHT KNEE  Discharge Diagnoses: Patient Active Problem List   Diagnosis Date Noted  . Status post total knee replacement using cement, right 05/04/2019    Past Medical History:  Diagnosis Date  . Arthritis   . Cancer ([place])    BREAST CA  . CHF (congestive heart failure) ([place])   . Complication of anesthesia    TAKES ALOT TO GET PT SEDATED AND THEN HARD TO WAKE UP-WOKE UP DURING BREAST BIOPSY  . COPD (chronic obstructive pulmonary disease) ([place])   . Dyspnea    WITH EXERTION  . Family history of adverse reaction to anesthesia    MOM-TOOK ALOT TO PUT HER TO SLEEP AND HARD TO WAKE UP  . Sleep apnea    USES BIPAP     Transfusion: None.   Consultants (if any):   Discharged Condition: Improved  Hospital Course: Elaine Smith is an 68 y.o. female who was admitted 05/04/2019 with a diagnosis of primary osteoarthritis of the right knee and went to the operating room on 05/04/2019 and underwent the above named procedures. The patient had SOB and hypotension on 05/05/19.  A CT scan releaved Pneumonia, and she was placed on oral Abx by Medicine.  She has been placed on Omnicef and azithromycin.  She was able to go to Rehab on 8/9.  She ambulated slowly and short distances.   Surgeries: Procedure(s): RIGHT TOTAL KNEE ARTHROPLASTY on 05/04/2019 Patient tolerated the surgery well. Taken to PACU where she was stabilized and then transferred to the orthopedic floor.  Started on Lovenox 40mg  q 24 hrs. Foot pumps applied bilaterally at 80 mm. Heels elevated on bed with rolled towels. No evidence of DVT. Negative Homan. Physical therapy started on day #1 for gait training and transfer. OT started day #1 for ADL and assisted devices.  Patient's Foley was removed on POD1, IV was removed on  POD2.  Implants: Right TKA using all-cemented [employer] Vanguard system with a 65 mm PCR femur, a 71 mm tibial tray with a 12 mm E-poly insert, and a 34 x 8.5 mm all-poly 3-pegged domed patella.  She was given perioperative antibiotics:  Anti-infectives (From admission, onward)   Start     Dose/Rate Route Frequency Ordered Stop   05/08/19 0000  azithromycin (ZITHROMAX) 500 MG tablet     500 mg Oral Daily 05/07/19 1056 05/11/19 2359   05/07/19 1000  cefdinir (OMNICEF) capsule 300 mg     300 mg Oral Every 12 hours 05/07/19 0749     05/07/19 1000  azithromycin (ZITHROMAX) tablet 500 mg     500 mg Oral Daily 05/07/19 0749     05/07/19 0000  cefdinir (OMNICEF) 300 MG capsule     300 mg Oral Every 12 hours 05/07/19 1056 05/12/19 2359   05/06/19 1500  azithromycin (ZITHROMAX) 500 mg in sodium chloride 0.9 % 250 mL IVPB  Status:  Discontinued     500 mg 250 mL/hr over 60 Minutes Intravenous Daily-1800 05/06/19 1445 05/07/19 0749   05/06/19 1500  cefTRIAXone (ROCEPHIN) 1 g in sodium chloride 0.9 % 100 mL IVPB  Status:  Discontinued     1 g 200 mL/hr over 30 Minutes Intravenous Daily-1800 05/06/19 1445 05/07/19 0749   05/04/19 1400  ceFAZolin (ANCEF) IVPB 2g/100 mL premix     2 g 200 mL/hr over 30  Minutes Intravenous Every 6 hours 05/04/19 1248 05/05/19 0234   05/04/19 0700  ceFAZolin (ANCEF) IVPB 2g/100 mL premix     2 g 200 mL/hr over 30 Minutes Intravenous  Once 05/04/19 0627 05/04/19 0852   05/04/19 0631  ceFAZolin (ANCEF) 2-4 GM/100ML-% IVPB    Note to Pharmacy: Milinda Cave   : cabinet override      05/04/19 0631 05/04/19 0847    .  She was given sequential compression devices, early ambulation, and Lovenox for DVT prophylaxis.  She benefited maximally from the hospital stay and there were no complications.    Recent vital signs:  Vitals:   05/07/19 2039 05/07/19 2321  BP:  (!) 154/76  Pulse:  (!) 109  Resp:  15  Temp:  100.2 F (37.9 C)  SpO2: 95% (!) 84%    Recent  laboratory studies:  Lab Results  Component Value Date   HGB 11.1 (L) 05/06/2019   HGB 11.4 (L) 05/05/2019   HGB 14.0 04/27/2019   Lab Results  Component Value Date   WBC 10.4 05/06/2019   PLT 163 05/06/2019   Lab Results  Component Value Date   INR 0.9 04/27/2019   Lab Results  Component Value Date   NA 137 05/07/2019   K 5.4 (H) 05/07/2019   CL 98 05/07/2019   CO2 32 05/07/2019   BUN 21 05/07/2019   CREATININE 0.83 05/07/2019   GLUCOSE 95 05/07/2019    Discharge Medications:   Allergies as of 05/08/2019      Reactions   Dilaudid [hydromorphone Hcl]    lethargic      Medication List    STOP taking these medications   lisinopril 5 MG tablet Commonly known as: ZESTRIL     TAKE these medications   amLODipine 5 MG tablet Commonly known as: NORVASC Take 1 tablet (5 mg total) by mouth daily.   atorvastatin 40 MG tablet Commonly known as: LIPITOR Take 40 mg by mouth every morning.   azithromycin 500 MG tablet Commonly known as: ZITHROMAX Take 1 tablet (500 mg total) by mouth daily for 3 days.   buPROPion 300 MG 24 hr tablet Commonly known as: WELLBUTRIN XL Take 300 mg by mouth every morning.   cefdinir 300 MG capsule Commonly known as: OMNICEF Take 1 capsule (300 mg total) by mouth every 12 (twelve) hours for 5 days.   COMBIVENT IN Inhale 1 spray into the lungs 4 (four) times daily.   ipratropium-albuterol 0.5-2.5 (3) MG/3ML Soln Commonly known as: DUONEB Take 3 mLs by nebulization as needed.   divalproex 500 MG DR tablet Commonly known as: DEPAKOTE Take 1,000 mg by mouth at bedtime.   enoxaparin 40 MG/0.4ML injection Commonly known as: LOVENOX Inject 0.4 mLs (40 mg total) into the skin daily.   furosemide 20 MG tablet Commonly known as: LASIX Take 20 mg by mouth as needed for fluid or edema.   meloxicam 7.5 MG tablet Commonly known as: MOBIC Take 7.5 mg by mouth as needed for pain.   nicotine 21 mg/24hr patch Commonly known as: NICODERM  CQ - dosed in mg/24 hours Place 21 mg onto the skin daily.   OVER THE COUNTER MEDICATION Take 2 capsules by mouth every morning. STACKER 3   oxyCODONE 5 MG immediate release tablet Commonly known as: Oxy IR/ROXICODONE Take 1-2 tablets (5-10 mg total) by mouth every 4 (four) hours as needed for moderate pain.   potassium chloride SA 20 MEQ tablet Commonly known as: K-DUR Take 20  mEq by mouth 2 (two) times daily.   traMADol 50 MG tablet Commonly known as: ULTRAM Take 1-2 tablets (50-100 mg total) by mouth every 6 (six) hours as needed for moderate pain. What changed:   how much to take  reasons to take this   traZODone 100 MG tablet Commonly known as: DESYREL Take 250-300 mg by mouth at bedtime.   venlafaxine XR 150 MG 24 hr capsule Commonly known as: EFFEXOR-XR Take 150 mg by mouth daily with breakfast.            Durable Medical Equipment  (From admission, onward)         Start     Ordered   05/04/19 1249  DME Bedside commode  Once    Question:  Patient needs a bedside commode to treat with the following condition  Answer:  Status post total knee replacement using cement, right   05/04/19 1248   05/04/19 1249  DME 3 n 1  Once     05/04/19 1248   05/04/19 1249  DME Walker rolling  Once    Question:  Patient needs a walker to treat with the following condition  Answer:  Status post total knee replacement using cement, right   05/04/19 1248          Diagnostic Studies: Ct Angio Chest Pe W Or Wo Contrast  Result Date: 05/06/2019 CLINICAL DATA:  Recent knee surgery with chest pain. History of breast carcinoma EXAM: CT ANGIOGRAPHY CHEST WITH CONTRAST TECHNIQUE: Multidetector CT imaging of the chest was performed using the standard protocol during bolus administration of intravenous contrast. Multiplanar CT image reconstructions and MIPs were obtained to evaluate the vascular anatomy. CONTRAST:  55mL OMNIPAQUE IOHEXOL 350 MG/ML SOLN COMPARISON:  None. FINDINGS:  Cardiovascular: There is no demonstrable pulmonary embolus. There is no thoracic aortic aneurysm or dissection. The visualized great vessels appear normal except for mild calcification in the proximal left subclavian artery without hemodynamically significant obstruction. There are foci of aortic atherosclerosis and foci of coronary artery calcification. There is no pericardial effusion or pericardial thickening. Prominence of the main pulmonary outflow tract is noted, measuring 3.1 cm in diameter. Mediastinum/Nodes: Visualized thyroid appears unremarkable. No adenopathy evident. No esophageal lesion appreciable. Lungs/Pleura: There is airspace consolidation in the right lower lobe. There is atelectatic change in the posterior left base. Lungs elsewhere are clear. No appreciable pleural effusion. Upper Abdomen: There is a degree of hepatic steatosis. Visualized upper abdominal structures otherwise appear unremarkable. Musculoskeletal: There is degenerative change in the thoracic spine. There are no blastic or lytic bone lesions. Patient is status post left mastectomy. Review of the MIP images confirms the above findings. IMPRESSION: 1. No demonstrable pulmonary embolus. No thoracic aortic aneurysm or dissection. There is aortic atherosclerosis as well as mild coronary artery calcification. 2. Prominence of the main pulmonary outflow tract, a finding indicative of a degree of pulmonary arterial hypertension. 3. Airspace consolidation consistent with pneumonia in the right lower lobe. Atelectatic change left base. 4.  Appreciable thoracic adenopathy. 5.  Hepatic steatosis. Aortic Atherosclerosis (ICD10-I70.0). Electronically Signed   By: Lowella Grip III M.D.   On: 05/06/2019 09:59   US Venous Img Lower Unilateral Right  Result Date: 05/05/2019 CLINICAL DATA:  Right calf pain and edema. Status post recent right knee arthroplasty. EXAM: RIGHT LOWER EXTREMITY VENOUS DOPPLER ULTRASOUND TECHNIQUE: Gray-scale  sonography with graded compression, as well as color Doppler and duplex ultrasound were performed to evaluate the lower extremity deep venous systems from  the level of the common femoral vein and including the common femoral, femoral, profunda femoral, popliteal and calf veins including the posterior tibial, peroneal and gastrocnemius veins when visible. The superficial great saphenous vein was also interrogated. Spectral Doppler was utilized to evaluate flow at rest and with distal augmentation maneuvers in the common femoral, femoral and popliteal veins. COMPARISON:  None. FINDINGS: Contralateral Common Femoral Vein: Respiratory phasicity is normal and symmetric with the symptomatic side. No evidence of thrombus. Normal compressibility. Common Femoral Vein: No evidence of thrombus. Normal compressibility, respiratory phasicity and response to augmentation. Saphenofemoral Junction: No evidence of thrombus. Normal compressibility and flow on color Doppler imaging. Profunda Femoral Vein: No evidence of thrombus. Normal compressibility and flow on color Doppler imaging. Femoral Vein: No evidence of thrombus. Normal compressibility, respiratory phasicity and response to augmentation. Popliteal Vein: No evidence of thrombus. Normal compressibility, respiratory phasicity and response to augmentation. Calf Veins: No evidence of thrombus. Normal compressibility and flow on color Doppler imaging. Superficial Great Saphenous Vein: No evidence of thrombus. Normal compressibility. Venous Reflux:  None. Other Findings: No evidence of superficial thrombophlebitis or abnormal fluid collection. IMPRESSION: No evidence of right lower extremity deep venous thrombosis. Electronically Signed   By: Aletta Edouard M.D.   On: 05/05/2019 17:15   Dg Knee Right Port  Result Date: 05/04/2019 CLINICAL DATA:  Total knee arthroplasty. EXAM: PORTABLE RIGHT KNEE - 1-2 VIEW COMPARISON:  None. FINDINGS: Total knee arthroplasty. Components well  positioned. No radiographically detectable complication. Fluid and air in the joint as expected. IMPRESSION: Good appearance following total knee arthroplasty. Electronically Signed   By: Nelson Chimes M.D.   On: 05/04/2019 11:33   Disposition: Discharge disposition: 03-Skilled [facility] for possible d/c home on 05/06/19 pending progress with PT.   Contact information for follow-up providers    Lattie Corns, PA-C Follow up in 14 day(s).   Specialty: Physician Assistant Why: Electa Sniff information: [place] [id number] [phone]            Contact information for after-discharge care    Destination    [facility] .   Service: Skilled Nursing Contact information: [address] [place] [phone]                   Signed: Prescott Parma, Stasia Somero PA-C 05/08/2019, 7:10 AM

## 2019-05-05 NOTE — Progress Notes (Signed)
PT Cancellation Note  Patient Details Name: Elaine Smith MRN: [id number] DOB: 1950/12/29   Cancelled Treatment:    Reason Eval/Treat Not Completed: Medical issues which prohibited therapy; per patient and daughter, MD has ordered LE ultrasound to rule out DVT.  Will hold PT services at this time pending outcome of imaging and will see pt at a future date/time as appropriate.     Linus Salmons PT, DPT 05/05/19, 3:06 PM

## 2019-05-05 NOTE — Progress Notes (Signed)
Pnts slightly hypotensive SBP 94-100 and MAP between 58-65. Notified Leim Fabry who gave order for 250 bolus of NS, hold am BP meds if ordered, and review labs along with repeat BP following bolus admin.   Pnt is also slightly disoriented this am, forgetting she is in hospital and why she was here, will continue to monitor.

## 2019-05-05 NOTE — TOC Initial Note (Signed)
Transition of Care [facility]) - Initial/Assessment Note    Patient Details  Name: Elaine Smith MRN: [id number] Date of Birth: 1950/11/17  [facility] CM/SW Contact:    Su Hilt, RN Phone Number: 05/05/2019, 2:40 PM  Clinical Narrative:                 Met with the patient and her daughter in the room, The patient lives with her sister that is not able to help her, her daughter could take a week off to help.  She has consented to go to SNF for rehab, FL[facility], [facility], and bedsearch sent, will review bed offers once obtained        Patient Goals and CMS Choice        Expected Discharge Plan and Services                                                Prior Living Arrangements/Services                       Activities of Daily Living Home Assistive Devices/Equipment: Eyeglasses, BIPAP, Cane (specify quad or straight) ADL Screening (condition at time of admission) Patient's cognitive ability adequate to safely complete daily activities?: Yes Is the patient deaf or have difficulty hearing?: Yes Does the patient have difficulty seeing, even when wearing glasses/contacts?: No Does the patient have difficulty concentrating, remembering, or making decisions?: No Patient able to express need for assistance with ADLs?: Yes Does the patient have difficulty dressing or bathing?: Yes Independently performs ADLs?: No Does the patient have difficulty walking or climbing stairs?: Yes Weakness of Legs: Right Weakness of Arms/Hands: None  Permission Sought/Granted                  Emotional Assessment              Admission diagnosis:  PRIMARY OSTEOARTHRITIS OF RIGHT KNEE Patient Active Problem List   Diagnosis Date Noted  . Status post total knee replacement using cement, right 05/04/2019   PCP:  Valera Castle, MD Pharmacy:   [place], [place] - [address] [place]  [address] Phone: [phone] Fax: [phone]     Social Determinants of Health (SDOH) Interventions    Readmission Risk Interventions No flowsheet data found.

## 2019-05-05 NOTE — Care Management Important Message (Signed)
Important Message  Patient Details  Name: Elaine BAILLARGEON MRN: [id number] Date of Birth: 05/22/1951   Medicare Important Message Given:  Yes  Initial Medicare IM given by Patient Access Associate on 05/04/2019 at 1:37pm.  Still valid.    Dannette Barbara 05/05/2019, 4:58 PM

## 2019-05-06 ENCOUNTER — Inpatient Hospital Stay: Payer: Medicare Other

## 2019-05-06 LAB — CBC WITH DIFFERENTIAL/PLATELET
Abs Immature Granulocytes: 0.04 10*3/uL (ref 0.00–0.07)
Basophils Absolute: 0 10*3/uL (ref 0.0–0.1)
Basophils Relative: 0 %
Eosinophils Absolute: 0.2 10*3/uL (ref 0.0–0.5)
Eosinophils Relative: 2 %
HCT: 34.6 % — ABNORMAL LOW (ref 36.0–46.0)
Hemoglobin: 11.1 g/dL — ABNORMAL LOW (ref 12.0–15.0)
Immature Granulocytes: 0 %
Lymphocytes Relative: 20 %
Lymphs Abs: 2.1 10*3/uL (ref 0.7–4.0)
MCH: 31.4 pg (ref 26.0–34.0)
MCHC: 32.1 g/dL (ref 30.0–36.0)
MCV: 98 fL (ref 80.0–100.0)
Monocytes Absolute: 1 10*3/uL (ref 0.1–1.0)
Monocytes Relative: 10 %
Neutro Abs: 7.1 10*3/uL (ref 1.7–7.7)
Neutrophils Relative %: 68 %
Platelets: 163 10*3/uL (ref 150–400)
RBC: 3.53 MIL/uL — ABNORMAL LOW (ref 3.87–5.11)
RDW: 13 % (ref 11.5–15.5)
WBC: 10.4 10*3/uL (ref 4.0–10.5)
nRBC: 0 % (ref 0.0–0.2)

## 2019-05-06 LAB — LACTIC ACID, PLASMA
Lactic Acid, Venous: 1.1 mmol/L (ref 0.5–1.9)
Lactic Acid, Venous: 1 mmol/L (ref 0.5–1.9)

## 2019-05-06 LAB — BASIC METABOLIC PANEL
Anion gap: 7 (ref 5–15)
BUN: 23 mg/dL (ref 8–23)
CO2: 28 mmol/L (ref 22–32)
Calcium: 8.2 mg/dL — ABNORMAL LOW (ref 8.9–10.3)
Chloride: 101 mmol/L (ref 98–111)
Creatinine, Ser: 0.72 mg/dL (ref 0.44–1.00)
GFR calc Af Amer: >60 mL/min (ref >60)
GFR calc non Af Amer: >60 mL/min (ref >60)
Glucose, Bld: 115 mg/dL — ABNORMAL HIGH (ref 70–99)
Potassium: 4.8 mmol/L (ref 3.5–5.1)
Sodium: 136 mmol/L (ref 135–145)

## 2019-05-06 MED ORDER — SODIUM CHLORIDE 0.9 % IV SOLN
500.0000 mg | Freq: Every day | INTRAVENOUS | Status: DC
  Administered 2019-05-06: 17:00:00 500 mg via INTRAVENOUS
  Filled 2019-05-06 (×2): qty 500

## 2019-05-06 MED ORDER — SODIUM CHLORIDE 0.9 % IV SOLN
1.0000 g | Freq: Every day | INTRAVENOUS | Status: DC
  Administered 2019-05-06: 1 g via INTRAVENOUS
  Filled 2019-05-06: qty 10
  Filled 2019-05-06: qty 1

## 2019-05-06 MED ORDER — IPRATROPIUM-ALBUTEROL 0.5-2.5 (3) MG/3ML IN SOLN
3.0000 mL | Freq: Four times a day (QID) | RESPIRATORY_TRACT | Status: DC
  Administered 2019-05-06 – 2019-05-09 (×10): 3 mL via RESPIRATORY_TRACT
  Filled 2019-05-06 (×9): qty 3

## 2019-05-06 MED ORDER — IOHEXOL 350 MG/ML SOLN
75.0000 mL | Freq: Once | INTRAVENOUS | Status: AC | PRN
  Administered 2019-05-06: 09:00:00 75 mL via INTRAVENOUS

## 2019-05-06 NOTE — Care Management Important Message (Signed)
Important Message  Patient Details  Name: Elaine Smith MRN: [id number] Date of Birth: [birth year]   Medicare Important Message Given:  Yes     Juliann Pulse A Kohler Pellerito 05/06/2019, 10:32 AM

## 2019-05-06 NOTE — Progress Notes (Signed)
Physical Therapy Treatment Patient Details Name: Elaine Smith MRN: [id number] DOB: [birth year] Today's Date: 05/06/2019    History of Present Illness Pt is a 68 yo F s/p elective R TKA.  PMH includes COPD, CHF, and breast CA.    PT Comments    Pt presents with deficits in strength, transfers, mobility, gait, balance, R knee ROM, and activity tolerance.  Pt very lethargic this session with nursing reporting pt recently receiving dilaudid for pain control.  Pt participated with limited therex per below with frequent need of encouragement to prevent falling asleep.  Pt assisted to EOB with mod A and did arouse some but ultimately continued to frequently drift off requiring extensive encouragement to participate in seated activities.  Pt was unsafe to attempt to stand and was assisted back to supine with Mod A.  Pt is making very slow progress towards goals secondary to weakness, R knee pain, and lethargy and will benefit from PT services in a SNF setting upon discharge to safely address above deficits for decreased caregiver assistance and eventual return to PLOF.      Follow Up Recommendations  SNF     Equipment Recommendations  Rolling walker with 5" wheels;3in1 (PT);Other (comment)(TBD at next venue of care upon discharge to SNF)    Recommendations for Other Services       Precautions / Restrictions Precautions Precautions: Fall Restrictions Weight Bearing Restrictions: Yes RLE Weight Bearing: Weight bearing as tolerated    Mobility  Bed Mobility Overal bed mobility: Needs Assistance Bed Mobility: Supine to Sit;Sit to Supine     Supine to sit: Mod assist Sit to supine: Mod assist   General bed mobility comments: Mod A for BLEs in and out of bed  Transfers                 General transfer comment: Unsafe to attempt secondary to pt's level of alertness/lethargy  Ambulation/Gait                 Stairs             Wheelchair Mobility    Modified  Rank[place] (Stroke Patients Only)       Balance Overall balance assessment: Needs assistance Sitting-balance support: Bilateral upper extremity supported;Feet supported Sitting balance-Leahy Scale: Fair Sitting balance - Comments: limited by lethargy                                    Cognition Arousal/Alertness: Lethargic Behavior During Therapy: Flat affect Overall Cognitive Status: Within Functional Limits for tasks assessed                                        Exercises Total Joint Exercises Ankle Circles/Pumps: Strengthening;Both;5 reps;10 reps Quad Sets: Strengthening;AROM;Both;5 reps;10 reps Hip ABduction/ADduction: AAROM;Right;10 reps Straight Leg Raises: AAROM;Right;10 reps Other Exercises Other Exercises: Static sitting at EOB x 5 min with attempts at gentle R knee AROM    General Comments        Pertinent Vitals/Pain Pain Assessment: 0-10 Pain Score: 9  Pain Location: R knee Pain Descriptors / Indicators: Sore;Aching Pain Intervention(s): Premedicated before session;Monitored during session    Home Living                      Prior Function  PT Goals (current goals can now be found in the care plan section) Progress towards PT goals: Not progressing toward goals - comment(Pt limited by lethargy)    Frequency    BID      PT Plan Discharge plan needs to be updated    Co-evaluation              AM-PAC PT "6 Clicks" Mobility   Outcome Measure  Help needed turning from your back to your side while in a flat bed without using bedrails?: A Lot Help needed moving from lying on your back to sitting on the side of a flat bed without using bedrails?: A Lot Help needed moving to and from a bed to a chair (including a wheelchair)?: A Lot Help needed standing up from a chair using your arms (e.g., wheelchair or bedside chair)?: A Lot Help needed to walk in hospital room?: Total Help needed climbing  3-5 steps with a railing? : Total 6 Click Score: 10    End of Session Equipment Utilized During Treatment: Oxygen Activity Tolerance: Patient limited by lethargy Patient left: in bed;with call bell/phone within reach;with bed alarm set;with SCD's reapplied;Other (comment);with nursing/sitter in room(Polar care to R knee) Nurse Communication: Mobility status PT Visit Diagnosis: Other abnormalities of gait and mobility (R26.89);Muscle weakness (generalized) (M62.81)     Time: 1001-1017 PT Time Calculation (min) (ACUTE ONLY): 16 min  Charges:  $Therapeutic Exercise: 8-22 mins                     D. Scott Paco Cislo PT, DPT 05/06/19, 10:46 AM

## 2019-05-06 NOTE — Progress Notes (Signed)
Physical Therapy Treatment Patient Details Name: Elaine Smith MRN: [id number] DOB: 1951/06/17 Today's Date: 05/06/2019    History of Present Illness Pt is a 68 yo F s/p elective R TKA.  PMH includes COPD, CHF, and breast CA.    PT Comments    Pt presents with deficits in strength, transfers, mobility, gait, balance, R knee ROM, and activity tolerance.  Pt more alert this session but continues to be lethargic quickly falling asleep if not engaged in an activity.  Pt required Mod A with bed mobility tasks for both BLE and trunk control.  Pt required +2 Mod A to stand from an elevated EOB along with extensive cues for sequencing and once in standing required heavy lean on the RW for support and was unable to advance either LE during amb attempt.  Pt unsafe to attempt transfer to the recliner at this time.  Pt will benefit from PT services in a SNF setting upon discharge to safely address above deficits for decreased caregiver assistance and eventual return to PLOF.     Follow Up Recommendations  SNF     Equipment Recommendations  Other (comment)(TBD at next venue of care)    Recommendations for Other Services       Precautions / Restrictions Precautions Precautions: Fall Restrictions Weight Bearing Restrictions: Yes RLE Weight Bearing: Weight bearing as tolerated    Mobility  Bed Mobility Overal bed mobility: Needs Assistance Bed Mobility: Supine to Sit;Sit to Supine     Supine to sit: Mod assist Sit to supine: Mod assist   General bed mobility comments: Mod A for BLEs in and out of bed and for trunk to full upright position  Transfers Overall transfer level: Needs assistance Equipment used: Rolling walker (2 wheeled) Transfers: Sit to/from Stand Sit to Stand: Mod assist;+2 physical assistance         General transfer comment: Max verbal and tactile cues for sequencing  Ambulation/Gait             General Gait Details: Unable to advance either LE this  session   Stairs             Wheelchair Mobility    Modified Rankin (Stroke Patients Only)       Balance Overall balance assessment: Needs assistance Sitting-balance support: Bilateral upper extremity supported;Feet supported Sitting balance-Leahy Scale: Fair     Standing balance support: Bilateral upper extremity supported Standing balance-Leahy Scale: Fair Standing balance comment: Heavy lean on the RW but no LOB in standing                            Cognition Arousal/Alertness: Lethargic Behavior During Therapy: Flat affect Overall Cognitive Status: Within Functional Limits for tasks assessed                                        Exercises Total Joint Exercises Ankle Circles/Pumps: Strengthening;Both;5 reps;10 reps Quad Sets: Strengthening;AROM;Both;5 reps;10 reps Gluteal Sets: Strengthening;Both;5 reps;10 reps Short Arc Quad: Strengthening;Both;10 reps Heel Slides: AAROM;Right;5 reps(Small amplitude to pt's tolerance) Hip ABduction/ADduction: AAROM;Right;10 reps;5 reps Straight Leg Raises: AAROM;Right;5 reps;10 reps Long Arc Quad: AROM;Both;10 reps;15 reps Knee Flexion: AROM;Both;10 reps;15 reps Goniometric ROM: R knee AROM: 2-80 deg limited by pain Other Exercises Other Exercises: Static sitting at EOB x 5 Other Exercises: HEP education/review Other Exercises: Positioning review to encourage R knee  ext PROM    General Comments        Pertinent Vitals/Pain Pain Assessment: 0-10 Pain Score: 2  Pain Location: R knee Pain Descriptors / Indicators: Sore;Aching Pain Intervention(s): Monitored during session;Premedicated before session    Home Living                      Prior Function            PT Goals (current goals can now be found in the care plan section) Progress towards PT goals: Not progressing toward goals - comment(Pt limted by pain and lethargy)    Frequency    BID      PT Plan Current  plan remains appropriate    Co-evaluation              AM-PAC PT "6 Clicks" Mobility   Outcome Measure  Help needed turning from your back to your side while in a flat bed without using bedrails?: A Lot Help needed moving from lying on your back to sitting on the side of a flat bed without using bedrails?: A Lot Help needed moving to and from a bed to a chair (including a wheelchair)?: A Lot Help needed standing up from a chair using your arms (e.g., wheelchair or bedside chair)?: A Lot Help needed to walk in hospital room?: Total Help needed climbing 3-5 steps with a railing? : Total 6 Click Score: 10    End of Session Equipment Utilized During Treatment: Gait belt;Oxygen Activity Tolerance: Patient limited by lethargy;Patient limited by pain Patient left: in bed;with call bell/phone within reach;with bed alarm set;with SCD's reapplied;Other (comment);with nursing/sitter in room(Polar care donned to R knee) Nurse Communication: Mobility status PT Visit Diagnosis: Other abnormalities of gait and mobility (R26.89);Muscle weakness (generalized) (M62.81)     Time: 3825-0539 PT Time Calculation (min) (ACUTE ONLY): 40 min  Charges:  $Therapeutic Exercise: 23-37 mins $Therapeutic Activity: 8-22 mins                     D. Scott Calub Tarnow PT, DPT 05/06/19, 3:01 PM

## 2019-05-06 NOTE — Progress Notes (Signed)
Subjective: 2 Days Post-Op Procedure(s) (LRB): RIGHT TOTAL KNEE ARTHROPLASTY (Right) Patient reports pain as mild.   Patient is doing better this morning.  She does have some increased pain since last night. PT and care management to assist with discharge planning. Negative for chest pain and shortness of breath Fever: no Gastrointestinal:Negative for nausea and vomiting  Objective: Vital signs in last 24 hours: Temp:  [98.1 F (36.7 C)-99.5 F (37.5 C)] 99.5 F (37.5 C) (08/06 2033) Pulse Rate:  [76-80] 79 (08/06 2033) Resp:  [18-20] 20 (08/06 2033) BP: (104-139)/(49-63) 139/63 (08/06 2033) SpO2:  [78 %-94 %] 78 % (08/06 2033)  Intake/Output from previous day:  Intake/Output Summary (Last 24 hours) at 05/06/2019 0633 Last data filed at 05/05/2019 1534 Gross per 24 hour  Intake 476.99 ml  Output 200 ml  Net 276.99 ml    Intake/Output this shift: No intake/output data recorded.  Labs: Recent Labs    05/05/19 0429  HGB 11.4*   Recent Labs    05/05/19 0429  WBC 10.2  RBC 3.67*  HCT 37.2  PLT 161   Recent Labs    05/05/19 0429  NA 138  K 4.9  CL 104  CO2 27  BUN 27*  CREATININE 1.16*  GLUCOSE 104*  CALCIUM 8.3*   No results for input(s): LABPT, INR in the last 72 hours.   EXAM General - Patient is Alert, Appropriate and Oriented Extremity - ABD soft Neurovascular intact Sensation intact distally Intact pulses distally Dorsiflexion/Plantar flexion intact Incision: scant drainage No cellulitis present  Negative Homan's to the right leg. Dressing/Incision - blood tinged drainage with the Ace wrap removed. Motor Function - intact, moving foot and toes well on exam.  Ambulated 3 feet with physical therapy.   Past Medical History:  Diagnosis Date  . Arthritis   . Cancer ([place])    BREAST CA  . CHF (congestive heart failure) ([place])   . Complication of anesthesia    TAKES ALOT TO GET PT SEDATED AND THEN HARD TO WAKE UP-WOKE UP DURING BREAST BIOPSY  .  COPD (chronic obstructive pulmonary disease) ([place])   . Dyspnea    WITH EXERTION  . Family history of adverse reaction to anesthesia    MOM-TOOK ALOT TO PUT HER TO SLEEP AND HARD TO WAKE UP  . Sleep apnea    USES BIPAP    Assessment/Plan: 2 Days Post-Op Procedure(s) (LRB): RIGHT TOTAL KNEE ARTHROPLASTY (Right) Active Problems:   Status post total knee replacement using cement, right  Estimated body mass index is 33.99 kg/m as calculated from the following:   Height as of this encounter: 5\' 4"  (1.626 m).   Weight as of this encounter: 89.8 kg. Advance diet Up with therapy   BP 104/49.  IV fluid bolus ordered.  Hold BP meds. Pain management. Up with therapy today.  Continue working on BM. Possible d/c home tomorrow pending progress with therapy today.  DVT Prophylaxis - Lovenox, Foot Pumps and TED hose Weight-Bearing as tolerated to right leg  Reche Dixon PA-C [facility] Orthopaedic Surgery 05/06/2019, 6:33 AM

## 2019-05-06 NOTE — Progress Notes (Signed)
Family Meeting Note  Advance Directive:yes  Today a meeting took place with the Patient and daughter.    The following clinical team members were present during this meeting:MD  The following were discussed:Patient's diagnosis:Knee replacement, Pneumonia, Respi failure, Bipap use. , Patient's progosis: Unable to determine and Goals for treatment: Full Code  She was drowsy this morning as per nursing due to to Dilaudid use. When I saw she was already recovering as per daughter due to bipap use. They were agreeing with plan and wanted the full scope of treatment in any worsening.  Additional follow-up to be provided: PMD  Time spent during discussion:20 minutes  Vaughan Basta, MD

## 2019-05-06 NOTE — Progress Notes (Signed)
Talked to Dr. Roland Rack, about patient's being drowsy after IV dilaudid, it was given at 0916 but since then she's been drowsy. Patient's spO2 is 85%-92%, Respiration 14, place her on home Bipap still at upper 80's. Per daughter patient is very sensitive with IV Dilaudid, order to discontinue medication. Per MD to also page hospitalist to assess patient. Awaiting for them to call me back.

## 2019-05-06 NOTE — TOC Progression Note (Signed)
Transition of Care [facility]) - Progression Note    Patient Details  Name: Elaine Smith MRN: [id number] Date of Birth: [birth year]  [facility] CM/SW [place], RN Phone Number: 05/06/2019, 2:33 PM  Clinical Narrative:     Went in the room to speak to the patient, she was having a procedure done, I called the daughter Rojelio Brenner that was part of the conversation yesterday. I explained the bed offers and reviewed them in detail.  The patient was hoping to go to [facility] and I explained that [facility] has offered a bed.  Misty has accepted the bed on her mothers behalf.  I called Rick at [facility] to accept the bed and accepted in the [facility] as well         Expected Discharge Plan and Services                                                 Social Determinants of Health (SDOH) Interventions    Readmission Risk Interventions No flowsheet data found.

## 2019-05-06 NOTE — Progress Notes (Signed)
Dr. Anselm Jungling is at bedside assessing patient. Not recommending Narcan for now, he say to keep patient on home Bipap and will do trial at dinner time to wean her off that and if she continuous to desat then will probably transfer to stepdown. Patient is more alert now since Bipap is in place. Respiratory also at bedside, gave breathing treatment.

## 2019-05-06 NOTE — Consult Note (Signed)
[place] [facility] [place] NAME: Elaine Smith    MR#:  [id number]  DATE OF BIRTH:  09/09/1951  DATE OF ADMISSION:  05/04/2019  PRIMARY CARE PHYSICIAN: Valera Castle, MD   REQUESTING/REFERRING PHYSICIAN:   CHIEF COMPLAINT:  No chief complaint on file.   HISTORY OF PRESENT ILLNESS:   68 year old female with history of breast cancer, CHF, COPD, OSA and arthritis admitted on 04/30/2019 with primary osteoarthritis of the right knee start total knee replacement postop day 2.  Patient is seen in consultation with orthopedic Dr. Roland Rack for acute respiratory insufficiency, hypotension and lethargy.  CT angio chest was obtained and showed airspace consolidation consistent with pneumonia in the right lower lobe.  On exam she was noted to be lethargic and easily drifts to sleep.  Recent labs today showed unremarkable CBC and BMP, lactic acid 1.1.  Due to this abnormal finding on CTA hospitalist were consulted for management of medical condition.  PAST MEDICAL HISTORY:   Past Medical History:  Diagnosis Date   Arthritis    Cancer ([place])    BREAST CA   CHF (congestive heart failure) ([facility])    Complication of anesthesia    TAKES ALOT TO GET PT SEDATED AND THEN HARD TO WAKE UP-WOKE UP DURING BREAST BIOPSY   COPD (chronic obstructive pulmonary disease) ([facility])    Dyspnea    WITH EXERTION   Family history of adverse reaction to anesthesia    MOM-TOOK ALOT TO PUT HER TO SLEEP AND HARD TO WAKE UP   Sleep apnea    USES BIPAP    PAST SURGICAL HISTORY:   Past Surgical History:  Procedure Laterality Date   ABDOMINAL HYSTERECTOMY     BREAST SURGERY     BIOPSY   COLONOSCOPY     FRACTURE SURGERY Right    LEG   MASTECTOMY Bilateral 2000   TOTAL KNEE ARTHROPLASTY Right 05/04/2019   Procedure: RIGHT TOTAL KNEE ARTHROPLASTY;  Surgeon: Corky Mull, MD;  Location: [facility] ORS;  Service: Orthopedics;  Laterality: Right;    SOCIAL HISTORY:    Social History   Tobacco Use   Smoking status: Former Smoker    Packs/day: 1.00    Years: 48.00    Pack years: 48.00    Types: Cigarettes    Quit date: 04/13/2019    Years since quitting: 0.0   Smokeless tobacco: Never Used  Substance Use Topics   Alcohol use: Yes    Comment: 1 BEER/WINE ONCE WEEKLY    FAMILY HISTORY:  History reviewed. No pertinent family history.  DRUG ALLERGIES:   Allergies  Allergen Reactions   Dilaudid [Hydromorphone Hcl]     lethargic    REVIEW OF SYSTEMS:   ROS Unable to obtain patient drifts easily to sleep  MEDICATIONS AT HOME:   Prior to Admission medications   Medication Sig Start Date End Date Taking? Authorizing Provider  atorvastatin (LIPITOR) 40 MG tablet Take 40 mg by mouth every morning.   Yes [provider]  buPROPion (WELLBUTRIN XL) 300 MG 24 hr tablet Take 300 mg by mouth every morning.   Yes [provider]  divalproex (DEPAKOTE) 500 MG DR tablet Take 1,000 mg by mouth at bedtime.   Yes [provider]  furosemide (LASIX) 20 MG tablet Take 20 mg by mouth as needed for fluid or edema.   Yes [provider]  Ipratropium-Albuterol (COMBIVENT IN) Inhale 1 spray into the lungs 4 (four) times daily.  Yes [provider]  ipratropium-albuterol (DUONEB) 0.5-2.5 (3) MG/3ML SOLN Take 3 mLs by nebulization as needed.   Yes [provider]  lisinopril (ZESTRIL) 5 MG tablet Take 5 mg by mouth every morning.   Yes [provider]  meloxicam (MOBIC) 7.5 MG tablet Take 7.5 mg by mouth as needed for pain.   Yes [provider]  nicotine (NICODERM CQ - DOSED IN MG/24 HOURS) 21 mg/24hr patch Place 21 mg onto the skin daily.   Yes [provider]  OVER THE COUNTER MEDICATION Take 2 capsules by mouth every morning. STACKER 3   Yes [provider]  potassium chloride SA (K-DUR) 20 MEQ tablet Take 20 mEq by mouth 2 (two) times daily.   Yes [provider]  traMADol (ULTRAM) 50 MG tablet Take 50 mg by mouth every 6 (six) hours as needed.   Yes [provider]  traZODone (DESYREL) 100 MG tablet Take 250-300 mg by mouth at bedtime.    Yes [provider]  venlafaxine XR (EFFEXOR-XR) 150 MG 24 hr capsule Take 150 mg by mouth daily with breakfast.   Yes [provider]  enoxaparin (LOVENOX) 40 MG/0.4ML injection Inject 0.4 mLs (40 mg total) into the skin daily. 05/05/19   Lattie Corns, PA-C  oxyCODONE (OXY IR/ROXICODONE) 5 MG immediate release tablet Take 1-2 tablets (5-10 mg total) by mouth every 4 (four) hours as needed for moderate pain. 05/05/19   Lattie Corns, PA-C  traMADol (ULTRAM) 50 MG tablet Take 1-2 tablets (50-100 mg total) by mouth every 6 (six) hours as needed for moderate pain. 05/05/19   Lattie Corns, PA-C      VITAL SIGNS:  Blood pressure (!) 140/58, pulse 78, temperature (!) 96 F (35.6 C), temperature source Oral, resp. rate 14, height 5\' 4"  (1.626 m), weight 89.8 kg, SpO2 95 %.  PHYSICAL EXAMINATION:   Physical Exam  GENERAL:  68 y.o.-year-old patient lying in the bed with no acute distress.  EYES: Pupils equal, round, reactive to light and accommodation. No scleral icterus. Extraocular muscles intact.  HEENT: Head atraumatic, normocephalic. Oropharynx and nasopharynx clear.  NECK:  Supple, no jugular venous distention. No thyroid enlargement, no tenderness.  LUNGS: Normal breath sounds bilaterally, no wheezing, rales,rhonchi or crepitation. No use of accessory muscles of respiration.  CARDIOVASCULAR: S1, S2 normal. No murmurs, rubs, or gallops.  ABDOMEN: Soft, nontender, nondistended. Bowel sounds present. No organomegaly or mass.  EXTREMITIES: , cyanosis, or clubbing. No rash or lesions. + pedal pulses MUSCULOSKELETAL: Normal bulk, and power was 5+ grip and elbow, knee, and ankle flexion and extension on theLEFT  NEUROLOGIC:Drowsy. CN 2-12 intact. Sensation to light  touch and cold stimuli intact bilaterally. Finger to nose nl. Babinski is downgoing. DTR's (biceps, patellar, and achilles) 2+ and symmetric throughout. Gait not tested due to safety concern. PSYCHIATRIC: The patient drifting easily to sleep  SKIN: No obvious rash, lesion, or ulcer.     DATA REVIEWED:  LABORATORY PANEL:   CBC Recent Labs  Lab 05/06/19 1431  WBC 10.4  HGB 11.1*  HCT 34.6*  PLT 163   ------------------------------------------------------------------------------------------------------------------  Chemistries  Recent Labs  Lab 05/06/19 0609  NA 136  K 4.8  CL 101  CO2 28  GLUCOSE 115*  BUN 23  CREATININE 0.72  CALCIUM 8.2*   ------------------------------------------------------------------------------------------------------------------  Cardiac Enzymes No results for input(s): TROPONINI in the last 168 hours. ------------------------------------------------------------------------------------------------------------------  RADIOLOGY:  Ct Angio Chest Pe W Or Wo Contrast  Result Date: 05/06/2019 CLINICAL DATA:  Recent knee surgery with chest pain. History of breast carcinoma EXAM: CT ANGIOGRAPHY CHEST WITH CONTRAST TECHNIQUE: Multidetector CT imaging of the chest was performed using the standard protocol during bolus administration of intravenous contrast. Multiplanar CT image reconstructions and MIPs were obtained to evaluate the vascular anatomy. CONTRAST:  31mL OMNIPAQUE IOHEXOL 350 MG/ML SOLN COMPARISON:  None. FINDINGS: Cardiovascular: There is no demonstrable pulmonary embolus. There is no thoracic aortic aneurysm or dissection. The visualized great vessels appear normal except for mild calcification in the proximal left subclavian artery without hemodynamically significant obstruction. There are foci of aortic atherosclerosis and foci of coronary artery calcification. There is no pericardial effusion or pericardial thickening. Prominence of the main  pulmonary outflow tract is noted, measuring 3.1 cm in diameter. Mediastinum/Nodes: Visualized thyroid appears unremarkable. No adenopathy evident. No esophageal lesion appreciable. Lungs/Pleura: There is airspace consolidation in the right lower lobe. There is atelectatic change in the posterior left base. Lungs elsewhere are clear. No appreciable pleural effusion. Upper Abdomen: There is a degree of hepatic steatosis. Visualized upper abdominal structures otherwise appear unremarkable. Musculoskeletal: There is degenerative change in the thoracic spine. There are no blastic or lytic bone lesions. Patient is status post left mastectomy. Review of the MIP images confirms the above findings. IMPRESSION: 1. No demonstrable pulmonary embolus. No thoracic aortic aneurysm or dissection. There is aortic atherosclerosis as well as mild coronary artery calcification. 2. Prominence of the main pulmonary outflow tract, a finding indicative of a degree of pulmonary arterial hypertension. 3. Airspace consolidation consistent with pneumonia in the right lower lobe. Atelectatic change left base. 4.  Appreciable thoracic adenopathy. 5.  Hepatic steatosis. Aortic Atherosclerosis (ICD10-I70.0). Electronically Signed   By: Lowella Grip III M.D.   On: 05/06/2019 09:59   US Venous Img Lower Unilateral Right  Result Date: 05/05/2019 CLINICAL DATA:  Right calf pain and edema. Status post recent right knee arthroplasty. EXAM: RIGHT LOWER EXTREMITY VENOUS DOPPLER ULTRASOUND TECHNIQUE: Gray-scale sonography with graded compression, as well as color Doppler and duplex ultrasound were performed to evaluate the lower extremity deep venous systems from the level of the common femoral vein and including the common femoral, femoral, profunda femoral, popliteal and calf veins including the posterior tibial, peroneal and gastrocnemius veins when visible. The superficial great saphenous vein was also interrogated. Spectral Doppler was  utilized to evaluate flow at rest and with distal augmentation maneuvers in the common femoral, femoral and popliteal veins. COMPARISON:  None. FINDINGS: Contralateral Common Femoral Vein: Respiratory phasicity is normal and symmetric with the symptomatic side. No evidence of thrombus. Normal compressibility. Common Femoral Vein: No evidence of thrombus. Normal compressibility, respiratory phasicity and response to augmentation. Saphenofemoral Junction: No evidence of thrombus. Normal compressibility and flow on color Doppler imaging. Profunda Femoral Vein: No evidence of thrombus. Normal compressibility and flow on color Doppler imaging. Femoral Vein: No evidence of thrombus. Normal compressibility, respiratory phasicity and response to augmentation. Popliteal Vein: No evidence of thrombus. Normal compressibility, respiratory phasicity and response to augmentation. Calf Veins: No evidence of thrombus. Normal compressibility and flow on color Doppler imaging. Superficial Great Saphenous Vein: No evidence of thrombus. Normal compressibility. Venous Reflux:  None. Other Findings: No evidence of superficial thrombophlebitis or abnormal fluid collection. IMPRESSION: No evidence of right lower extremity deep venous thrombosis. Electronically Signed   By: Aletta Edouard M.D.   On: 05/05/2019 17:15    EKG:  EKG: there are no previous tracings available for comparison.  IMPRESSION AND  PLAN:   68 y.o. female with history of breast cancer, CHF, COPD, OSA and arthritis admitted on 04/30/2019 with primary osteoarthritis of the right knee start total knee replacement postop day 2  1.  Right lower lobe pneumonia -patient noted with acute respiratory insufficiency and lethargy - CT angiogram chest reviewed and shows areas of pneumonia in the right lower lobe - Obtain blood cultures - Check CBC and procalcitonin - Start empiric antibiotics with ceftriaxone + azithromycin  2. Osteoarthritis of right knee -status post  total knee replacement postop day 2 - Management per orthopedic  3. OSA -history of severe OSA worse in REM with baseline hypoxia on BiPAP -Patient noted with increased sleepiness/lethargy -Placed on BiPAP with improvement  4. COPD-no evidence of acute exacerbation -Duo nebs every 6 hours  5. HLD  + Goal LDL<100 - Atorvastatin 40mg  PO qhs  6. HTN  + Goal BP <130/80 -Continue lisinopril   7. Anxiety and depression -Continue Effexor, trazodone and Wellbutrin  All the records are reviewed and case discussed with ED provider. Management plans discussed with the patient, family and they are in agreement.  CODE STATUS: FULL  TOTAL TIME TAKING CARE OF THIS PATIENT: 50 minutes.    on 05/06/2019 at 10:24 PM   Rufina Falco, DNP, FNP-BC [facility] Nurse Practitioner Between 7am to 6pm - Pager [phone]  After 6pm go to [email or web address] - password [email or web address] [facility]  Office  [phone]  CC: Primary care physician; Valera Castle, MD

## 2019-05-07 LAB — BASIC METABOLIC PANEL
Anion gap: 7 (ref 5–15)
BUN: 21 mg/dL (ref 8–23)
CO2: 32 mmol/L (ref 22–32)
Calcium: 8.3 mg/dL — ABNORMAL LOW (ref 8.9–10.3)
Chloride: 98 mmol/L (ref 98–111)
Creatinine, Ser: 0.83 mg/dL (ref 0.44–1.00)
GFR calc Af Amer: >60 mL/min (ref >60)
GFR calc non Af Amer: >60 mL/min (ref >60)
Glucose, Bld: 95 mg/dL (ref 70–99)
Potassium: 5.4 mmol/L — ABNORMAL HIGH (ref 3.5–5.1)
Sodium: 137 mmol/L (ref 135–145)

## 2019-05-07 MED ORDER — AZITHROMYCIN 500 MG PO TABS
500.0000 mg | ORAL_TABLET | Freq: Every day | ORAL | Status: DC
  Administered 2019-05-07 – 2019-05-09 (×3): 500 mg via ORAL
  Filled 2019-05-07 (×3): qty 1

## 2019-05-07 MED ORDER — PATIROMER SORBITEX CALCIUM 8.4 G PO PACK
16.8000 g | PACK | Freq: Every day | ORAL | Status: DC
  Administered 2019-05-07: 13:00:00 16.8 g via ORAL
  Filled 2019-05-07 (×2): qty 2

## 2019-05-07 MED ORDER — HYDRALAZINE HCL 20 MG/ML IJ SOLN: 10.0000 mg | Freq: Four times a day (QID) | INTRAMUSCULAR | Status: DC | PRN

## 2019-05-07 MED ORDER — AMLODIPINE BESYLATE 5 MG PO TABS: 5.0000 mg | ORAL_TABLET | Freq: Every day | ORAL | 0 refills | Status: AC

## 2019-05-07 MED ORDER — CEFDINIR 300 MG PO CAPS
300.0000 mg | ORAL_CAPSULE | Freq: Two times a day (BID) | ORAL | Status: DC
  Administered 2019-05-07 – 2019-05-09 (×5): 300 mg via ORAL
  Filled 2019-05-07 (×6): qty 1

## 2019-05-07 MED ORDER — CEFDINIR 300 MG PO CAPS: 300.0000 mg | ORAL_CAPSULE | Freq: Two times a day (BID) | ORAL | 0 refills | Status: AC

## 2019-05-07 MED ORDER — LISINOPRIL 5 MG PO TABS: 5.0000 mg | ORAL_TABLET | Freq: Every morning | ORAL | Status: DC

## 2019-05-07 MED ORDER — AMLODIPINE BESYLATE 5 MG PO TABS
5.0000 mg | ORAL_TABLET | Freq: Every day | ORAL | Status: DC
  Administered 2019-05-07: 5 mg via ORAL
  Filled 2019-05-07 (×2): qty 1

## 2019-05-07 MED ORDER — AZITHROMYCIN 500 MG PO TABS: 500.0000 mg | ORAL_TABLET | Freq: Every day | ORAL | 0 refills | Status: AC

## 2019-05-07 NOTE — Progress Notes (Signed)
Subjective: 3 Days Post-Op Procedure(s) (LRB): RIGHT TOTAL KNEE ARTHROPLASTY (Right) Patient reports pain as mild.   Patient is doing better this morning.  She was lethargic yesterday and seems to be more awake and alert today.  She feels like she is improving. PT and care management to assist with discharge planning. Negative for chest pain and shortness of breath Fever: no Gastrointestinal:Negative for nausea and vomiting  Objective: Vital signs in last 24 hours: Temp:  [98.5 F (36.9 C)-98.9 F (37.2 C)] 98.6 F (37 C) (08/07 2100) Pulse Rate:  [78-110] 78 (08/07 2100) Resp:  [14] 14 (08/07 1518) BP: (103-141)/(58-76) 140/58 (08/07 2100) SpO2:  [85 %-95 %] 95 % (08/07 2100)  Intake/Output from previous day:  Intake/Output Summary (Last 24 hours) at 05/07/2019 0726 Last data filed at 05/07/2019 0215 Gross per 24 hour  Intake 350 ml  Output 500 ml  Net -150 ml    Intake/Output this shift: No intake/output data recorded.  Labs: Recent Labs    05/05/19 0429 05/06/19 1431  HGB 11.4* 11.1*   Recent Labs    05/05/19 0429 05/06/19 1431  WBC 10.2 10.4  RBC 3.67* 3.53*  HCT 37.2 34.6*  PLT 161 163   Recent Labs    05/06/19 0609 05/07/19 0408  NA 136 137  K 4.8 5.4*  CL 101 98  CO2 28 32  BUN 23 21  CREATININE 0.72 0.83  GLUCOSE 115* 95  CALCIUM 8.2* 8.3*   No results for input(s): LABPT, INR in the last 72 hours.   EXAM General - Patient is Alert, Appropriate and Oriented Extremity - ABD soft Neurovascular intact Sensation intact distally Intact pulses distally Dorsiflexion/Plantar flexion intact Incision: scant drainage No cellulitis present  Negative Homan's to the right leg. Dressing/Incision - blood tinged drainage with the Ace wrap removed. Motor Function - intact, moving foot and toes well on exam.  Ambulated 3 feet with physical therapy.   Past Medical History:  Diagnosis Date  . Arthritis   . Cancer ([place])    BREAST CA  . CHF (congestive  heart failure) ([place])   . Complication of anesthesia    TAKES ALOT TO GET PT SEDATED AND THEN HARD TO WAKE UP-WOKE UP DURING BREAST BIOPSY  . COPD (chronic obstructive pulmonary disease) ([place])   . Dyspnea    WITH EXERTION  . Family history of adverse reaction to anesthesia    MOM-TOOK ALOT TO PUT HER TO SLEEP AND HARD TO WAKE UP  . Sleep apnea    USES BIPAP    Assessment/Plan: 3 Days Post-Op Procedure(s) (LRB): RIGHT TOTAL KNEE ARTHROPLASTY (Right) Active Problems:   Status post total knee replacement using cement, right  Estimated body mass index is 33.99 kg/m as calculated from the following:   Height as of this encounter: 5\' 4"  (1.626 m).   Weight as of this encounter: 89.8 kg. Advance diet Up with therapy   Chest CT revealed pneumonia.  Medical management to assist.  Appreciated. Pain management. Up with therapy today.  Continue working on BM. Possible d/c to rehab tomorrow pending progress with pneumonia and therapy today.  DVT Prophylaxis - Lovenox, Foot Pumps and TED hose Weight-Bearing as tolerated to right leg  Reche Dixon PA-C [facility] Orthopaedic Surgery 05/07/2019, 7:26 AM

## 2019-05-07 NOTE — Progress Notes (Signed)
Physical Therapy Treatment Patient Details Name: Elaine Smith MRN: [id number] DOB: 07/03/1951 Today's Date: 05/07/2019    History of Present Illness Pt is a 68 yo F s/p elective R TKA.  PMH includes COPD, CHF, and breast CA.    PT Comments    Participated in exercises as described below.  To edge of bed with mod a x 1 min a x 1.  Once sitting, verbal cues to lean forward for balance but able to sit unsupported.  Stood with mod a x 2 and was able to transfer to recliner.   Follow Up Recommendations  SNF     Equipment Recommendations       Recommendations for Other Services       Precautions / Restrictions Precautions Precautions: Fall Restrictions Weight Bearing Restrictions: Yes RLE Weight Bearing: Weight bearing as tolerated    Mobility  Bed Mobility Overal bed mobility: Needs Assistance Bed Mobility: Supine to Sit     Supine to sit: Mod assist        Transfers Overall transfer level: Needs assistance Equipment used: Rolling walker (2 wheeled) Transfers: Sit to/from Stand Sit to Stand: Mod assist;+2 safety/equipment         General transfer comment: heavy assist requires +2  Ambulation/Gait Ambulation/Gait assistance: Min assist;+2 physical assistance Gait Distance (Feet): 3 Feet Assistive device: Rolling walker (2 wheeled) Gait Pattern/deviations: Step-to pattern;Decreased step length - right;Decreased step length - left;Shuffle Gait velocity: decreased   General Gait Details: unable to take true steps but is able to shuffle feet mostly sideways for transfers.   Stairs             Wheelchair Mobility    Modified Rankin (Stroke Patients Only)       Balance Overall balance assessment: Needs assistance Sitting-balance support: Feet supported Sitting balance-Leahy Scale: Fair     Standing balance support: Bilateral upper extremity supported Standing balance-Leahy Scale: Fair Standing balance comment: Heavy lean on the RW but no LOB  in standing                            Cognition Arousal/Alertness: Awake/alert Behavior During Therapy: WFL for tasks assessed/performed Overall Cognitive Status: Within Functional Limits for tasks assessed                                        Exercises Total Joint Exercises Ankle Circles/Pumps: Strengthening;Both;5 reps;10 reps Quad Sets: Strengthening;AROM;Both;5 reps;10 reps Gluteal Sets: Strengthening;Both;5 reps;10 reps Heel Slides: AAROM;Right;10 reps Hip ABduction/ADduction: AAROM;Right;10 reps Straight Leg Raises: AAROM;Right;10 reps Long Arc Quad: 10 reps;Right;AAROM Knee Flexion: AAROM;Right;5 reps Goniometric ROM: 2-70 - self limites due to pain today    General Comments        Pertinent Vitals/Pain Pain Assessment: Faces Faces Pain Scale: Hurts even more Pain Location: R knee - with mobility Pain Descriptors / Indicators: Sore;Aching Pain Intervention(s): Limited activity within patient's tolerance;Monitored during session;Repositioned    Home Living                      Prior Function            PT Goals (current goals can now be found in the care plan section) Progress towards PT goals: Progressing toward goals    Frequency    BID      PT Plan Current plan remains  appropriate    Co-evaluation              AM-PAC PT "6 Clicks" Mobility   Outcome Measure  Help needed turning from your back to your side while in a flat bed without using bedrails?: A Lot Help needed moving from lying on your back to sitting on the side of a flat bed without using bedrails?: A Lot Help needed moving to and from a bed to a chair (including a wheelchair)?: A Lot Help needed standing up from a chair using your arms (e.g., wheelchair or bedside chair)?: A Lot Help needed to walk in hospital room?: Total Help needed climbing 3-5 steps with a railing? : Total 6 Click Score: 10    End of Session Equipment Utilized During  Treatment: Gait belt;Oxygen Activity Tolerance: Patient limited by pain Patient left: in chair;with call bell/phone within reach;with chair alarm set Nurse Communication: Mobility status       Time: 0940-1003 PT Time Calculation (min) (ACUTE ONLY): 23 min  Charges:  $Gait Training: 8-22 mins $Therapeutic Exercise: 8-22 mins                    Chesley Noon, PTA 05/07/19, 12:08 PM

## 2019-05-07 NOTE — Progress Notes (Signed)
[place] [facility] [place] NAME: Elaine Smith    MR#:  [id number]  DATE OF BIRTH:  [birth year]  SUBJECTIVE:   Patient doing well this morning.  Reports no shortness of breath or cough.  Reports that she wears oxygen at night.  REVIEW OF SYSTEMS:    Review of Systems  Constitutional: Negative for fever, chills weight loss HENT: Negative for ear pain, nosebleeds, congestion, facial swelling, rhinorrhea, neck pain, neck stiffness and ear discharge.   Respiratory: Negative for cough, shortness of breath, wheezing  Cardiovascular: Negative for chest pain, palpitations and leg swelling.  Gastrointestinal: Negative for heartburn, abdominal pain, vomiting, diarrhea or consitpation Genitourinary: Negative for dysuria, urgency, frequency, hematuria Musculoskeletal: Negative for back pain or joint pain Neurological: Negative for dizziness, seizures, syncope, focal weakness,  numbness and headaches.  Hematological: Does not bruise/bleed easily.  Psychiatric/Behavioral: Negative for hallucinations, confusion, dysphoric mood    Tolerating Diet: yes      DRUG ALLERGIES:   Allergies  Allergen Reactions  . Dilaudid [Hydromorphone Hcl]     lethargic    VITALS:  Blood pressure (!) 150/66, pulse 100, temperature 99.4 F (37.4 C), temperature source Oral, resp. rate 12, height 5\' 4"  (1.626 m), weight 89.8 kg, SpO2 95 %.  PHYSICAL EXAMINATION:  Constitutional: Appears well-developed and well-nourished. No distress. HENT: Normocephalic. Marland Kitchen Oropharynx is clear and moist.  Eyes: Conjunctivae and EOM are normal. PERRLA, no scleral icterus.  Neck: Normal ROM. Neck supple. No JVD. No tracheal deviation. CVS: RRR, S1/S2 +, no murmurs, no gallops, no carotid bruit.  Pulmonary: Effort and breath sounds normal, no stridor, rhonchi, wheezes, rales.  Abdominal: Soft. BS +,  no distension, tenderness, rebound or guarding.  Musculoskeletal: Normal range of motion.  No edema and no tenderness.  Neuro: Alert. CN 2-12 grossly intact. No focal deficits. Skin: Skin is warm and dry. No rash noted. Psychiatric: Normal mood and affect.  Right knee bandaged   LABORATORY PANEL:   CBC Recent Labs  Lab 05/06/19 1431  WBC 10.4  HGB 11.1*  HCT 34.6*  PLT 163   ------------------------------------------------------------------------------------------------------------------  Chemistries  Recent Labs  Lab 05/07/19 0408  NA 137  K 5.4*  CL 98  CO2 32  GLUCOSE 95  BUN 21  CREATININE 0.83  CALCIUM 8.3*   ------------------------------------------------------------------------------------------------------------------  Cardiac Enzymes No results for input(s): TROPONINI in the last 168 hours. ------------------------------------------------------------------------------------------------------------------  RADIOLOGY:  Ct Angio Chest Pe W Or Wo Contrast  Result Date: 05/06/2019 CLINICAL DATA:  Recent knee surgery with chest pain. History of breast carcinoma EXAM: CT ANGIOGRAPHY CHEST WITH CONTRAST TECHNIQUE: Multidetector CT imaging of the chest was performed using the standard protocol during bolus administration of intravenous contrast. Multiplanar CT image reconstructions and MIPs were obtained to evaluate the vascular anatomy. CONTRAST:  14mL OMNIPAQUE IOHEXOL 350 MG/ML SOLN COMPARISON:  None. FINDINGS: Cardiovascular: There is no demonstrable pulmonary embolus. There is no thoracic aortic aneurysm or dissection. The visualized great vessels appear normal except for mild calcification in the proximal left subclavian artery without hemodynamically significant obstruction. There are foci of aortic atherosclerosis and foci of coronary artery calcification. There is no pericardial effusion or pericardial thickening. Prominence of the main pulmonary outflow tract is noted, measuring 3.1 cm in diameter. Mediastinum/Nodes: Visualized thyroid appears  unremarkable. No adenopathy evident. No esophageal lesion appreciable. Lungs/Pleura: There is airspace consolidation in the right lower lobe. There is atelectatic change in the posterior left base. Lungs elsewhere are clear. No appreciable pleural  effusion. Upper Abdomen: There is a degree of hepatic steatosis. Visualized upper abdominal structures otherwise appear unremarkable. Musculoskeletal: There is degenerative change in the thoracic spine. There are no blastic or lytic bone lesions. Patient is status post left mastectomy. Review of the MIP images confirms the above findings. IMPRESSION: 1. No demonstrable pulmonary embolus. No thoracic aortic aneurysm or dissection. There is aortic atherosclerosis as well as mild coronary artery calcification. 2. Prominence of the main pulmonary outflow tract, a finding indicative of a degree of pulmonary arterial hypertension. 3. Airspace consolidation consistent with pneumonia in the right lower lobe. Atelectatic change left base. 4.  Appreciable thoracic adenopathy. 5.  Hepatic steatosis. Aortic Atherosclerosis (ICD10-I70.0). Electronically Signed   By: Lowella Grip III M.D.   On: 05/06/2019 09:59   US Venous Img Lower Unilateral Right  Result Date: 05/05/2019 CLINICAL DATA:  Right calf pain and edema. Status post recent right knee arthroplasty. EXAM: RIGHT LOWER EXTREMITY VENOUS DOPPLER ULTRASOUND TECHNIQUE: Gray-scale sonography with graded compression, as well as color Doppler and duplex ultrasound were performed to evaluate the lower extremity deep venous systems from the level of the common femoral vein and including the common femoral, femoral, profunda femoral, popliteal and calf veins including the posterior tibial, peroneal and gastrocnemius veins when visible. The superficial great saphenous vein was also interrogated. Spectral Doppler was utilized to evaluate flow at rest and with distal augmentation maneuvers in the common femoral, femoral and popliteal  veins. COMPARISON:  None. FINDINGS: Contralateral Common Femoral Vein: Respiratory phasicity is normal and symmetric with the symptomatic side. No evidence of thrombus. Normal compressibility. Common Femoral Vein: No evidence of thrombus. Normal compressibility, respiratory phasicity and response to augmentation. Saphenofemoral Junction: No evidence of thrombus. Normal compressibility and flow on color Doppler imaging. Profunda Femoral Vein: No evidence of thrombus. Normal compressibility and flow on color Doppler imaging. Femoral Vein: No evidence of thrombus. Normal compressibility, respiratory phasicity and response to augmentation. Popliteal Vein: No evidence of thrombus. Normal compressibility, respiratory phasicity and response to augmentation. Calf Veins: No evidence of thrombus. Normal compressibility and flow on color Doppler imaging. Superficial Great Saphenous Vein: No evidence of thrombus. Normal compressibility. Venous Reflux:  None. Other Findings: No evidence of superficial thrombophlebitis or abnormal fluid collection. IMPRESSION: No evidence of right lower extremity deep venous thrombosis. Electronically Signed   By: Aletta Edouard M.D.   On: 05/05/2019 17:15     ASSESSMENT AND PLAN:    68 year old female with chronic diastolic heart failure, COPD with chronic hypoxic respiratory failure on oxygen at night and OSA who was seen in consultation due to acute respiratory distress.  1.  Acute on chronic approxi-respiratory failure in the setting of right lower lobe pneumonia: Patient may be changed to oral antibiotics including cefdinir and azithromycin.  Continue cefdinir for 5 more days and azithromycin for 3 days. Patient uses oxygen at night. CT scan negative for PE.  2.  Hyperkalemia: This is been treated this morning with Veltassa. Stop lisinopril  3.  Essential hypertension: Patient should not be discharged on lisinopril.  Norvasc has been started.  She will need outpatient  follow-up regarding her blood pressure as well as a BMP check early next week.   4.  Osteoarthritis: Patient is postoperative day #3 Management as per orthopedic surgery  5.  OSA: She would benefit from CPAP at night  6.  COPD without signs of exacerbation  Patient is medically stable for discharge with plan as stated above.  This was relayed to primary  care team.  Medicine service will sign off.  Please call me if you have further questions.     Management plans discussed with the patient and she is in agreement.  CODE STATUS: full  TOTAL TIME TAKING CARE OF THIS PATIENT: 30 minutes.     POSSIBLE D/C today to SNF   Bettey Costa M.D on 05/07/2019 at 9:57 AM  Between 7am to 6pm - Pager - [phone] After 6pm go to [email or web address] - password [email or web address] [facility]  Office  [phone]  CC: Primary care physician; Valera Castle, MD  Note: This dictation was prepared with Dragon dictation along with smaller phrase technology. Any transcriptional errors that result from this process are unintentional.

## 2019-05-07 NOTE — Progress Notes (Signed)
Physical Therapy Treatment Patient Details Name: Elaine Smith MRN: [id number] DOB: 07/23/1951 Today's Date: 05/07/2019    History of Present Illness Pt is a 68 yo F s/p elective R TKA.  PMH includes COPD, CHF, and breast CA.    PT Comments    Pt needing to use commode.  Stood with mod a x 2 and increased time to transfer to commode at bedside.  Again, unable to take true steps but is able to side-shuffle to commode.  Successful with small formed BM. Transferred to bed with same assist but was able to remain standing longer and once turned to bed side-shuffle up towards [place].  Continues to require +2 assist at all times with no true gait to date.   Follow Up Recommendations  SNF     Equipment Recommendations  Rolling walker with 5" wheels    Recommendations for Other Services       Precautions / Restrictions Precautions Precautions: Fall Restrictions Weight Bearing Restrictions: Yes RLE Weight Bearing: Weight bearing as tolerated    Mobility  Bed Mobility Overal bed mobility: Needs Assistance Bed Mobility: Sit to Supine     Supine to sit: Mod assist Sit to supine: Mod assist;+2 for physical assistance      Transfers Overall transfer level: Needs assistance Equipment used: Rolling walker (2 wheeled) Transfers: Sit to/from Stand Sit to Stand: Mod assist;+2 safety/equipment         General transfer comment: heavy assist requires +2  Ambulation/Gait Ambulation/Gait assistance: Min assist;+2 physical assistance Gait Distance (Feet): 3 Feet Assistive device: Rolling walker (2 wheeled) Gait Pattern/deviations: Step-to pattern;Decreased step length - right;Decreased step length - left;Shuffle Gait velocity: decreased   General Gait Details: unable to take true steps but is able to shuffle feet mostly sideways for transfers.   Stairs             Wheelchair Mobility    Modified Rankin (Stroke Patients Only)       Balance Overall balance assessment:  Needs assistance Sitting-balance support: Feet supported Sitting balance-Leahy Scale: Fair     Standing balance support: Bilateral upper extremity supported Standing balance-Leahy Scale: Fair Standing balance comment: Heavy lean on the RW but no LOB in standing                            Cognition Arousal/Alertness: Awake/alert Behavior During Therapy: WFL for tasks assessed/performed Overall Cognitive Status: Within Functional Limits for tasks assessed                                        Exercises Total Joint Exercises Ankle Circles/Pumps: Strengthening;Both;5 reps;10 reps Quad Sets: Strengthening;AROM;Both;5 reps;10 reps Gluteal Sets: Strengthening;Both;5 reps;10 reps Heel Slides: AAROM;Right;10 reps Hip ABduction/ADduction: AAROM;Right;10 reps Straight Leg Raises: AAROM;Right;10 reps Long Arc Quad: 10 reps;Right;AAROM Knee Flexion: AAROM;Right;5 reps Goniometric ROM: 2-70 - self limites due to pain today Other Exercises Other Exercises: to commode for small formed BM    General Comments        Pertinent Vitals/Pain Pain Assessment: Faces Faces Pain Scale: Hurts whole lot Pain Location: R knee - with mobility Pain Descriptors / Indicators: Sore;Aching Pain Intervention(s): Limited activity within patient's tolerance;Monitored during session;Repositioned;Ice applied    Home Living                      Prior Function  PT Goals (current goals can now be found in the care plan section) Progress towards PT goals: Progressing toward goals    Frequency    BID      PT Plan Current plan remains appropriate    Co-evaluation              AM-PAC PT "6 Clicks" Mobility   Outcome Measure  Help needed turning from your back to your side while in a flat bed without using bedrails?: A Lot Help needed moving from lying on your back to sitting on the side of a flat bed without using bedrails?: A Lot Help needed  moving to and from a bed to a chair (including a wheelchair)?: A Lot Help needed standing up from a chair using your arms (e.g., wheelchair or bedside chair)?: A Lot Help needed to walk in hospital room?: Total Help needed climbing 3-5 steps with a railing? : Total 6 Click Score: 10    End of Session Equipment Utilized During Treatment: Gait belt;Oxygen Activity Tolerance: Patient limited by pain Patient left: in bed;with call bell/phone within reach;with bed alarm set Nurse Communication: Mobility status       Time: 1135-1200 PT Time Calculation (min) (ACUTE ONLY): 25 min  Charges:  $Gait Training: 8-22 mins $Therapeutic Exercise: 8-22 mins $Therapeutic Activity: 8-22 mins                    Chesley Noon, PTA 05/07/19, 12:12 PM

## 2019-05-08 LAB — POTASSIUM: Potassium: 4.2 mmol/L (ref 3.5–5.1)

## 2019-05-08 NOTE — Progress Notes (Signed)
Physical Therapy Treatment Patient Details Name: Elaine Smith MRN: [id number] DOB: 10-25-1950 Today's Date: 05/08/2019    History of Present Illness Pt is a 68 yo F s/p elective R TKA.  PMH includes COPD, CHF, and breast CA.    PT Comments    Participated in exercises as described below.  To edge of bed with increased time and mod a x 1.  Improved over yesterday.  BP taken in sitting 121/79 - BP noted to be soft with nursing this am.  Stood with mod a x 1 and was able to side shuffle to chair at bedside again with no true steps but overall less assist.  She remained up in recliner and encouraged trying to increase time OOB today to 3-4 hours.  Discussed with nursing and encouraged +2 assist to return to bed due to low hight of chair, anticipated fatigue and assist to return to supine.     Follow Up Recommendations  SNF     Equipment Recommendations  Rolling walker with 5" wheels    Recommendations for Other Services       Precautions / Restrictions Precautions Precautions: Fall Restrictions Weight Bearing Restrictions: Yes RLE Weight Bearing: Weight bearing as tolerated    Mobility  Bed Mobility Overal bed mobility: Needs Assistance Bed Mobility: Supine to Sit     Supine to sit: Mod assist        Transfers Overall transfer level: Needs assistance Equipment used: Rolling walker (2 wheeled) Transfers: Sit to/from Stand Sit to Stand: Mod assist         General transfer comment: able to transfer +1 assist to chair.  Nursing encouraged to have +2 assist back to bed later today.  Ambulation/Gait Ambulation/Gait assistance: Min assist Gait Distance (Feet): 3 Feet Assistive device: Rolling walker (2 wheeled)   Gait velocity: decreased   General Gait Details: unable to take true steps but is able to shuffle feet mostly sideways for transfers.   Stairs             Wheelchair Mobility    Modified Rankin (Stroke Patients Only)       Balance Overall  balance assessment: Needs assistance Sitting-balance support: Feet supported Sitting balance-Leahy Scale: Fair     Standing balance support: Bilateral upper extremity supported Standing balance-Leahy Scale: Fair                              Cognition Arousal/Alertness: Awake/alert Behavior During Therapy: WFL for tasks assessed/performed Overall Cognitive Status: Within Functional Limits for tasks assessed                                        Exercises Total Joint Exercises Ankle Circles/Pumps: Strengthening;Both;5 reps;10 reps Quad Sets: Strengthening;AROM;Both;5 reps;10 reps Gluteal Sets: Strengthening;Both;5 reps;10 reps Short Arc Quad: Strengthening;Both;10 reps Heel Slides: AAROM;Right;10 reps Hip ABduction/ADduction: AAROM;Right;10 reps Straight Leg Raises: AAROM;Right;10 reps Long Arc Quad: 10 reps;Right;AAROM Knee Flexion: AAROM;Right;5 reps Goniometric ROM: 2-70 - generally more comfortable today with ROM but still remains limited.  Does getter in sitting as she only tolerates abut 5 degrees of flexion in supine.    General Comments        Pertinent Vitals/Pain Pain Assessment: Faces Faces Pain Scale: Hurts even more Pain Location: R knee - with mobility Pain Descriptors / Indicators: Sore;Aching Pain Intervention(s): Limited activity within patient's  tolerance;Monitored during session;Premedicated before session;Ice applied    Home Living                      Prior Function            PT Goals (current goals can now be found in the care plan section) Progress towards PT goals: Progressing toward goals    Frequency    BID      PT Plan Current plan remains appropriate    Co-evaluation              AM-PAC PT "6 Clicks" Mobility   Outcome Measure  Help needed turning from your back to your side while in a flat bed without using bedrails?: A Lot Help needed moving from lying on your back to sitting on  the side of a flat bed without using bedrails?: A Lot Help needed moving to and from a bed to a chair (including a wheelchair)?: A Lot Help needed standing up from a chair using your arms (e.g., wheelchair or bedside chair)?: A Lot Help needed to walk in hospital room?: Total Help needed climbing 3-5 steps with a railing? : Total 6 Click Score: 10    End of Session Equipment Utilized During Treatment: Gait belt;Oxygen Activity Tolerance: Patient tolerated treatment well Patient left: in chair;with call bell/phone within reach;with chair alarm set         Time: 8850-2774 PT Time Calculation (min) (ACUTE ONLY): 24 min  Charges:  $Gait Training: 8-22 mins $Therapeutic Exercise: 8-22 mins                    Chesley Noon, PTA 05/08/19, 10:53 AM

## 2019-05-08 NOTE — Progress Notes (Signed)
pts BP this am 110/51, MD Mody at nurses station and notified. (scheduled BP meds held). Pts repeat K 4.2, per MD ok to d/c [place].

## 2019-05-08 NOTE — Progress Notes (Signed)
Subjective: 4 Days Post-Op Procedure(s) (LRB): RIGHT TOTAL KNEE ARTHROPLASTY (Right) Patient reports pain as mild.   Patient is doing better this morning.    She feels like she is improving.  Using oral antibiotics for pneumonia. PT and care management to assist with discharge planning. Negative for chest pain and shortness of breath Fever: no Gastrointestinal:Negative for nausea and vomiting  Objective: Vital signs in last 24 hours: Temp:  [98.2 F (36.8 C)-100.2 F (37.9 C)] 100.2 F (37.9 C) (08/08 2321) Pulse Rate:  [94-109] 109 (08/08 2321) Resp:  [12-18] 15 (08/08 2321) BP: (125-154)/(60-97) 154/76 (08/08 2321) SpO2:  [84 %-99 %] 84 % (08/08 2321)  Intake/Output from previous day:  Intake/Output Summary (Last 24 hours) at 05/08/2019 0708 Last data filed at 05/08/2019 0601 Gross per 24 hour  Intake 480 ml  Output 1250 ml  Net -770 ml    Intake/Output this shift: No intake/output data recorded.  Labs: Recent Labs    05/06/19 1431  HGB 11.1*   Recent Labs    05/06/19 1431  WBC 10.4  RBC 3.53*  HCT 34.6*  PLT 163   Recent Labs    05/06/19 0609 05/07/19 0408  NA 136 137  K 4.8 5.4*  CL 101 98  CO2 28 32  BUN 23 21  CREATININE 0.72 0.83  GLUCOSE 115* 95  CALCIUM 8.2* 8.3*   No results for input(s): LABPT, INR in the last 72 hours.   EXAM General - Patient is Alert, Appropriate and Oriented Extremity - ABD soft Neurovascular intact Sensation intact distally Intact pulses distally Dorsiflexion/Plantar flexion intact Incision: scant drainage No cellulitis present  Negative Homan's to the right leg. Dressing/Incision - blood tinged drainage with the Ace wrap removed. Motor Function - intact, moving foot and toes well on exam.  Ambulated 3 feet with physical therapy.   Past Medical History:  Diagnosis Date  . Arthritis   . Cancer ([place])    BREAST CA  . CHF (congestive heart failure) ([place])   . Complication of anesthesia    TAKES ALOT TO GET PT  SEDATED AND THEN HARD TO WAKE UP-WOKE UP DURING BREAST BIOPSY  . COPD (chronic obstructive pulmonary disease) ([place])   . Dyspnea    WITH EXERTION  . Family history of adverse reaction to anesthesia    MOM-TOOK ALOT TO PUT HER TO SLEEP AND HARD TO WAKE UP  . Sleep apnea    USES BIPAP    Assessment/Plan: 4 Days Post-Op Procedure(s) (LRB): RIGHT TOTAL KNEE ARTHROPLASTY (Right) Active Problems:   Status post total knee replacement using cement, right  Estimated body mass index is 33.99 kg/m as calculated from the following:   Height as of this encounter: 5\' 4"  (1.626 m).   Weight as of this encounter: 89.8 kg. Advance diet Up with therapy   Chest CT revealed pneumonia.  Medical management to assist.  Appreciated. Pain management. Up with therapy today.  Discharge to rehab on oral antibiotics.  DVT Prophylaxis - Lovenox, Foot Pumps and TED hose Weight-Bearing as tolerated to right leg  Reche Dixon PA-C [facility] Orthopaedic Surgery 05/08/2019, 7:08 AM

## 2019-05-08 NOTE — Plan of Care (Signed)

## 2019-05-09 LAB — SARS CORONAVIRUS 2 BY RT PCR (HOSPITAL ORDER, PERFORMED IN ~~LOC~~ HOSPITAL LAB): SARS Coronavirus 2: NEGATIVE

## 2019-05-09 NOTE — Progress Notes (Signed)
Physical Therapy Treatment Patient Details Name: Elaine Smith MRN: [id number] DOB: [birth year] Today's Date: 05/09/2019    History of Present Illness Pt is a 68 yo F s/p elective R TKA.  PMH includes COPD, CHF, and breast CA.    PT Comments    Pt presents with deficits in strength, transfers, mobility, gait, balance, R knee ROM, and activity tolerance.  Pt required mod A with bed mobility tasks with cues for proper sequencing.  Pt min-mod A to stand from an elevated surface with cues for sequencing.  Pt was able to take several slow, antalgic steps at the EOB with heavy lean on the RW but was steady without LOB.  Pt's SpO2 >/= 92% during the session with no adverse symptoms noted other than R knee pain.  Pt will benefit from PT services in a SNF setting upon discharge to safely address above deficits for decreased caregiver assistance and eventual return to PLOF.     Follow Up Recommendations  SNF     Equipment Recommendations  Other (comment)(TBD at next venue of care)    Recommendations for Other Services       Precautions / Restrictions Precautions Precautions: Fall Restrictions Weight Bearing Restrictions: Yes RLE Weight Bearing: Weight bearing as tolerated    Mobility  Bed Mobility Overal bed mobility: Needs Assistance Bed Mobility: Supine to Sit;Sit to Supine     Supine to sit: Mod assist Sit to supine: Mod assist   General bed mobility comments: Mod A for BLEs in and out of bed and for trunk to full upright position  Transfers Overall transfer level: Needs assistance Equipment used: Rolling walker (2 wheeled) Transfers: Sit to/from Stand Sit to Stand: Min assist;From elevated surface;Mod assist         General transfer comment: Mod verbal cues for positioning prior to standing/sitting and for increased trunk flexion  Ambulation/Gait Ambulation/Gait assistance: Min assist Gait Distance (Feet): 3 Feet Assistive device: Rolling walker (2 wheeled) Gait  Pattern/deviations: Step-to pattern;Decreased step length - right;Decreased step length - left;Shuffle;Decreased stance time - right;Antalgic Gait velocity: decreased   General Gait Details: Pt able to take several antalgic steps with heavy lean on the RW, short B step length, and slow cadence.   Stairs             Wheelchair Mobility    Modified Rankin (Stroke Patients Only)       Balance Overall balance assessment: Needs assistance Sitting-balance support: Feet supported Sitting balance-Leahy Scale: Good     Standing balance support: Bilateral upper extremity supported Standing balance-Leahy Scale: Fair Standing balance comment: Heavy lean on the RW but no LOB in standing                            Cognition Arousal/Alertness: Awake/alert Behavior During Therapy: WFL for tasks assessed/performed Overall Cognitive Status: Within Functional Limits for tasks assessed                                        Exercises Total Joint Exercises Ankle Circles/Pumps: Strengthening;Both;10 reps;15 reps Quad Sets: Strengthening;AROM;Both;10 reps;15 reps Gluteal Sets: Strengthening;Both;10 reps;15 reps Short Arc Quad: Strengthening;Both;10 reps Heel Slides: AAROM;10 reps;Left Hip ABduction/ADduction: AAROM;Right;10 reps Straight Leg Raises: AAROM;Right;10 reps Long Arc Quad: 10 reps;Right;AAROM;15 reps Knee Flexion: AAROM;Right;15 reps;10 reps Goniometric ROM: R knee AROM: 2-75 deg Other Exercises Other Exercises: HEP  education/review per handout Other Exercises: Sit to/from stand training from multiple height surfaces    General Comments        Pertinent Vitals/Pain Pain Assessment: 0-10 Pain Score: 2  Pain Location: R knee Pain Descriptors / Indicators: Sore;Aching Pain Intervention(s): Premedicated before session;Monitored during session    Home Living Family/patient expects to be discharged to:: Private residence                     Prior Function            PT Goals (current goals can now be found in the care plan section) Progress towards PT goals: Progressing toward goals    Frequency    BID      PT Plan Current plan remains appropriate    Co-evaluation              AM-PAC PT "6 Clicks" Mobility   Outcome Measure  Help needed turning from your back to your side while in a flat bed without using bedrails?: A Lot Help needed moving from lying on your back to sitting on the side of a flat bed without using bedrails?: A Lot Help needed moving to and from a bed to a chair (including a wheelchair)?: A Little Help needed standing up from a chair using your arms (e.g., wheelchair or bedside chair)?: A Little Help needed to walk in hospital room?: Total Help needed climbing 3-5 steps with a railing? : Total 6 Click Score: 12    End of Session Equipment Utilized During Treatment: Gait belt;Oxygen Activity Tolerance: Patient tolerated treatment well Patient left: in chair;with call bell/phone within reach;with chair alarm set;with SCD's reapplied;Other (comment)(Polar care to R knee) Nurse Communication: Mobility status PT Visit Diagnosis: Other abnormalities of gait and mobility (R26.89);Muscle weakness (generalized) (M62.81)     Time: 5449-2010 PT Time Calculation (min) (ACUTE ONLY): 38 min  Charges:  $Therapeutic Exercise: 23-37 mins $Therapeutic Activity: 8-22 mins                     D. Scott Trenesha Alcaide PT, DPT 05/09/19, 1:39 PM

## 2019-05-09 NOTE — Progress Notes (Signed)
Called report to [place] at [facility]. Answered all questions

## 2019-05-09 NOTE — Care Management Important Message (Signed)
Important Message  Patient Details  Name: NICOYA FRIEL MRN: [id number] Date of Birth: 11-Sep-1951   Medicare Important Message Given:  Yes     Su Hilt, RN 05/09/2019, 11:40 AM

## 2019-05-09 NOTE — Progress Notes (Signed)
Swabbed, time frame until 1:40pm

## 2019-05-09 NOTE — TOC Transition Note (Signed)
Transition of Care [facility]) - CM/SW Discharge Note   Patient Details  Name: Elaine Smith MRN: [id number] Date of Birth: 25-Jun-1951  [facility]) CM/SW Contact:  Su Hilt, RN Phone Number: 05/09/2019, 11:35 AM   Clinical Narrative:    Spoke with the patient's daughter Rojelio Brenner and alerted her that the patient will DC to [facility] today to room E15 via EMS, I called Ricky at [facility] to confirm the room has not changed, he stated it is E15 and report can be called to [phone]. Misty will call [facility] to see if she needs to fill out papers and what items she can bring for the patient.   EMS will transport once the covid test results come back the nurse to call for transport   Final next level of care: Skilled Nursing Facility Barriers to Discharge: No Barriers Identified   Patient Goals and CMS Choice        Discharge Placement                       Discharge Plan and Services                                     Social Determinants of Health (SDOH) Interventions     Readmission Risk Interventions No flowsheet data found.

## 2019-05-09 NOTE — Progress Notes (Signed)
Updated daughter Rojelio Brenner on transport by EMS, which was called in at 2pm

## 2019-05-09 NOTE — Progress Notes (Signed)
Subjective: 5 Days Post-Op Procedure(s) (LRB): RIGHT TOTAL KNEE ARTHROPLASTY (Right) Patient reports pain as mild.   Patient is doing better this morning, no complaints. PT and care management to assist with discharge planning to SNF today Negative for chest pain and shortness of breath Fever: no Gastrointestinal:Negative for nausea and vomiting  Objective: Vital signs in last 24 hours: Temp:  [98.2 F (36.8 C)-98.9 F (37.2 C)] 98.2 F (36.8 C) (08/10 0818) Pulse Rate:  [91-104] 91 (08/10 0818) Resp:  [16-24] 24 (08/10 0818) BP: (82-125)/(44-79) 122/44 (08/10 0818) SpO2:  [91 %-98 %] 94 % (08/10 0818)  Intake/Output from previous day:  Intake/Output Summary (Last 24 hours) at 05/09/2019 0825 Last data filed at 05/09/2019 0300 Gross per 24 hour  Intake 840 ml  Output 1250 ml  Net -410 ml    Intake/Output this shift: No intake/output data recorded.  Labs: Recent Labs    05/06/19 1431  HGB 11.1*   Recent Labs    05/06/19 1431  WBC 10.4  RBC 3.53*  HCT 34.6*  PLT 163   Recent Labs    05/07/19 0408 05/08/19 0943  NA 137  --   K 5.4* 4.2  CL 98  --   CO2 32  --   BUN 21  --   CREATININE 0.83  --   GLUCOSE 95  --   CALCIUM 8.3*  --    No results for input(s): LABPT, INR in the last 72 hours.   EXAM General - Patient is Alert, Appropriate and Oriented Extremity - ABD soft Neurovascular intact Sensation intact distally Intact pulses distally Dorsiflexion/Plantar flexion intact Incision: scant drainage No cellulitis present  Negative Homan's to the right leg. Dressing/Incision - blood tinged drainage mild Motor Function - intact, moving foot and toes well on exam.   Past Medical History:  Diagnosis Date  . Arthritis   . Cancer ([place])    BREAST CA  . CHF (congestive heart failure) ([place])   . Complication of anesthesia    TAKES ALOT TO GET PT SEDATED AND THEN HARD TO WAKE UP-WOKE UP DURING BREAST BIOPSY  . COPD (chronic obstructive pulmonary  disease) ([place])   . Dyspnea    WITH EXERTION  . Family history of adverse reaction to anesthesia    MOM-TOOK ALOT TO PUT HER TO SLEEP AND HARD TO WAKE UP  . Sleep apnea    USES BIPAP    Assessment/Plan: 5 Days Post-Op Procedure(s) (LRB): RIGHT TOTAL KNEE ARTHROPLASTY (Right) Active Problems:   Status post total knee replacement using cement, right    Right lower lobe pneumonia  Estimated body mass index is 33.99 kg/m as calculated from the following:   Height as of this encounter: 5\' 4"  (1.626 m).   Weight as of this encounter: 89.8 kg. Advance diet Up with therapy  Right lower lobe pneumonia -continue with p.o. antibiotics per internal medicine Vital signs stable Labs are stable Pain well controlled Plan to discharge to skilled nursing facility today   DVT Prophylaxis - Lovenox, Foot Pumps and TED hose Weight-Bearing as tolerated to right leg  T. Rachelle Hora PA-C [facility] Orthopaedic Surgery 05/09/2019, 8:25 AM

## 2019-05-09 NOTE — Discharge Summary (Signed)
Physician Discharge Summary  Patient ID: MAANYA HIPPERT MRN: [id number] DOB/AGE: 03-18-1951 68 y.o.  Admit date: 05/04/2019 Discharge date: 05/09/2019  Admission Diagnoses:  PRIMARY OSTEOARTHRITIS OF RIGHT KNEE   Discharge Diagnoses: Patient Active Problem List   Diagnosis Date Noted  . Status post total knee replacement using cement, right 05/04/2019    Past Medical History:  Diagnosis Date  . Arthritis   . Cancer ([place])    BREAST CA  . CHF (congestive heart failure) ([place])   . Complication of anesthesia    TAKES ALOT TO GET PT SEDATED AND THEN HARD TO WAKE UP-WOKE UP DURING BREAST BIOPSY  . COPD (chronic obstructive pulmonary disease) ([place])   . Dyspnea    WITH EXERTION  . Family history of adverse reaction to anesthesia    MOM-TOOK ALOT TO PUT HER TO SLEEP AND HARD TO WAKE UP  . Sleep apnea    USES BIPAP     Transfusion: None   Consultants (if any):   Discharged Condition: Improved  Hospital Course: KELSA JAWOROWSKI is an 68 y.o. female who was admitted 05/04/2019 with a diagnosis of right knee osteoarthritis and went to the operating room on 05/04/2019 and underwent the above named procedures.    Surgeries: Procedure(s): RIGHT TOTAL KNEE ARTHROPLASTY on 05/04/2019 Patient tolerated the surgery well. Taken to PACU where she was stabilized and then transferred to the orthopedic floor.  Started on Lovenox 40 mg q 24 hrs. Foot pumps applied bilaterally at 80 mm. Heels elevated on bed with rolled towels. No evidence of DVT. Negative [place]. Physical therapy started on day #1 for gait training and transfer. OT started day #1 for ADL and assisted devices.  Patient's foley was d/c on day #1.  On postop day 2 patient noted to have low oxygen saturations, 85 to 90%.  Internal medicine consulted and CT chest showed right upper lobe pneumonia.  No sign of PE.  Patient was started on IV antibiotics.  On postop day 3, patient doing very well.  Oxygen levels improving, patient  asymptomatic.  Patient switched to p.o. cefdinir and azithromycin.  Postop day 4 patient doing well, vital signs stable.  Slow progress with PT.  On post op day #5 patient was stable and ready for discharge to skilled nursing facility for the continuation of physical therapy.  Implants: Right TKA using all-cemented [employer] Vanguard system with a 65 mm PCR femur, a 71 mm tibial tray with a 12 mm E-poly insert, and a 34 x 8.5 mm all-poly 3-pegged domed patella.   She was given perioperative antibiotics:  Anti-infectives (From admission, onward)   Start     Dose/Rate Route Frequency Ordered Stop   05/08/19 0000  azithromycin (ZITHROMAX) 500 MG tablet     500 mg Oral Daily 05/07/19 1056 05/11/19 2359   05/07/19 1000  cefdinir (OMNICEF) capsule 300 mg     300 mg Oral Every 12 hours 05/07/19 0749     05/07/19 1000  azithromycin (ZITHROMAX) tablet 500 mg     500 mg Oral Daily 05/07/19 0749     05/07/19 0000  cefdinir (OMNICEF) 300 MG capsule     300 mg Oral Every 12 hours 05/07/19 1056 05/12/19 2359   05/06/19 1500  azithromycin (ZITHROMAX) 500 mg in sodium chloride 0.9 % 250 mL IVPB  Status:  Discontinued     500 mg 250 mL/hr over 60 Minutes Intravenous Daily-1800 05/06/19 1445 05/07/19 0749   05/06/19 1500  cefTRIAXone (ROCEPHIN) 1 g in sodium  chloride 0.9 % 100 mL IVPB  Status:  Discontinued     1 g 200 mL/hr over 30 Minutes Intravenous Daily-1800 05/06/19 1445 05/07/19 0749   05/04/19 1400  ceFAZolin (ANCEF) IVPB 2g/100 mL premix     2 g 200 mL/hr over 30 Minutes Intravenous Every 6 hours 05/04/19 1248 05/05/19 0234   05/04/19 0700  ceFAZolin (ANCEF) IVPB 2g/100 mL premix     2 g 200 mL/hr over 30 Minutes Intravenous  Once 05/04/19 0627 05/04/19 0852   05/04/19 0631  ceFAZolin (ANCEF) 2-4 GM/100ML-% IVPB    Note to Pharmacy: Milinda Cave   : cabinet override      05/04/19 0631 05/04/19 0847    .  She was given sequential compression devices, early ambulation, and Lovenox, teds  for DVT prophylaxis.  She benefited maximally from the hospital stay and there were no complications.    Recent vital signs:  Vitals:   05/09/19 0020 05/09/19 0818  BP: (!) 125/56 (!) 122/44  Pulse: 99 91  Resp: 16 (!) 24  Temp: 98.2 F (36.8 C) 98.2 F (36.8 C)  SpO2: 91% 94%    Recent laboratory studies:  Lab Results  Component Value Date   HGB 11.1 (L) 05/06/2019   HGB 11.4 (L) 05/05/2019   HGB 14.0 04/27/2019   Lab Results  Component Value Date   WBC 10.4 05/06/2019   PLT 163 05/06/2019   Lab Results  Component Value Date   INR 0.9 04/27/2019   Lab Results  Component Value Date   NA 137 05/07/2019   K 4.2 05/08/2019   CL 98 05/07/2019   CO2 32 05/07/2019   BUN 21 05/07/2019   CREATININE 0.83 05/07/2019   GLUCOSE 95 05/07/2019    Discharge Medications:   Allergies as of 05/09/2019      Reactions   Dilaudid [hydromorphone Hcl]    lethargic      Medication List    STOP taking these medications   lisinopril 5 MG tablet Commonly known as: ZESTRIL     TAKE these medications   amLODipine 5 MG tablet Commonly known as: NORVASC Take 1 tablet (5 mg total) by mouth daily.   atorvastatin 40 MG tablet Commonly known as: LIPITOR Take 40 mg by mouth every morning.   azithromycin 500 MG tablet Commonly known as: ZITHROMAX Take 1 tablet (500 mg total) by mouth daily for 3 days.   buPROPion 300 MG 24 hr tablet Commonly known as: WELLBUTRIN XL Take 300 mg by mouth every morning.   cefdinir 300 MG capsule Commonly known as: OMNICEF Take 1 capsule (300 mg total) by mouth every 12 (twelve) hours for 5 days.   COMBIVENT IN Inhale 1 spray into the lungs 4 (four) times daily.   ipratropium-albuterol 0.5-2.5 (3) MG/3ML Soln Commonly known as: DUONEB Take 3 mLs by nebulization as needed.   divalproex 500 MG DR tablet Commonly known as: DEPAKOTE Take 1,000 mg by mouth at bedtime.   enoxaparin 40 MG/0.4ML injection Commonly known as: LOVENOX Inject 0.4  mLs (40 mg total) into the skin daily.   furosemide 20 MG tablet Commonly known as: LASIX Take 20 mg by mouth as needed for fluid or edema.   meloxicam 7.5 MG tablet Commonly known as: MOBIC Take 7.5 mg by mouth as needed for pain.   nicotine 21 mg/24hr patch Commonly known as: NICODERM CQ - dosed in mg/24 hours Place 21 mg onto the skin daily.   OVER THE COUNTER MEDICATION Take 2  capsules by mouth every morning. STACKER 3   oxyCODONE 5 MG immediate release tablet Commonly known as: Oxy IR/ROXICODONE Take 1-2 tablets (5-10 mg total) by mouth every 4 (four) hours as needed for moderate pain.   potassium chloride SA 20 MEQ tablet Commonly known as: K-DUR Take 20 mEq by mouth 2 (two) times daily.   traMADol 50 MG tablet Commonly known as: ULTRAM Take 1-2 tablets (50-100 mg total) by mouth every 6 (six) hours as needed for moderate pain. What changed:   how much to take  reasons to take this   traZODone 100 MG tablet Commonly known as: DESYREL Take 250-300 mg by mouth at bedtime.   venlafaxine XR 150 MG 24 hr capsule Commonly known as: EFFEXOR-XR Take 150 mg by mouth daily with breakfast.            Durable Medical Equipment  (From admission, onward)         Start     Ordered   05/04/19 1249  DME Bedside commode  Once    Question:  Patient needs a bedside commode to treat with the following condition  Answer:  Status post total knee replacement using cement, right   05/04/19 1248   05/04/19 1249  DME 3 n 1  Once     05/04/19 1248   05/04/19 1249  DME Walker rolling  Once    Question:  Patient needs a walker to treat with the following condition  Answer:  Status post total knee replacement using cement, right   05/04/19 1248          Diagnostic Studies: Ct Angio Chest Pe W Or Wo Contrast  Result Date: 05/06/2019 CLINICAL DATA:  Recent knee surgery with chest pain. History of breast carcinoma EXAM: CT ANGIOGRAPHY CHEST WITH CONTRAST TECHNIQUE:  Multidetector CT imaging of the chest was performed using the standard protocol during bolus administration of intravenous contrast. Multiplanar CT image reconstructions and MIPs were obtained to evaluate the vascular anatomy. CONTRAST:  65mL OMNIPAQUE IOHEXOL 350 MG/ML SOLN COMPARISON:  None. FINDINGS: Cardiovascular: There is no demonstrable pulmonary embolus. There is no thoracic aortic aneurysm or dissection. The visualized great vessels appear normal except for mild calcification in the proximal left subclavian artery without hemodynamically significant obstruction. There are foci of aortic atherosclerosis and foci of coronary artery calcification. There is no pericardial effusion or pericardial thickening. Prominence of the main pulmonary outflow tract is noted, measuring 3.1 cm in diameter. Mediastinum/Nodes: Visualized thyroid appears unremarkable. No adenopathy evident. No esophageal lesion appreciable. Lungs/Pleura: There is airspace consolidation in the right lower lobe. There is atelectatic change in the posterior left base. Lungs elsewhere are clear. No appreciable pleural effusion. Upper Abdomen: There is a degree of hepatic steatosis. Visualized upper abdominal structures otherwise appear unremarkable. Musculoskeletal: There is degenerative change in the thoracic spine. There are no blastic or lytic bone lesions. Patient is status post left mastectomy. Review of the MIP images confirms the above findings. IMPRESSION: 1. No demonstrable pulmonary embolus. No thoracic aortic aneurysm or dissection. There is aortic atherosclerosis as well as mild coronary artery calcification. 2. Prominence of the main pulmonary outflow tract, a finding indicative of a degree of pulmonary arterial hypertension. 3. Airspace consolidation consistent with pneumonia in the right lower lobe. Atelectatic change left base. 4.  Appreciable thoracic adenopathy. 5.  Hepatic steatosis. Aortic Atherosclerosis (ICD10-I70.0).  Electronically Signed   By: Lowella Grip III M.D.   On: 05/06/2019 09:59   US Venous Img Lower Unilateral  Right  Result Date: 05/05/2019 CLINICAL DATA:  Right calf pain and edema. Status post recent right knee arthroplasty. EXAM: RIGHT LOWER EXTREMITY VENOUS DOPPLER ULTRASOUND TECHNIQUE: Gray-scale sonography with graded compression, as well as color Doppler and duplex ultrasound were performed to evaluate the lower extremity deep venous systems from the level of the common femoral vein and including the common femoral, femoral, profunda femoral, popliteal and calf veins including the posterior tibial, peroneal and gastrocnemius veins when visible. The superficial great saphenous vein was also interrogated. Spectral Doppler was utilized to evaluate flow at rest and with distal augmentation maneuvers in the common femoral, femoral and popliteal veins. COMPARISON:  None. FINDINGS: Contralateral Common Femoral Vein: Respiratory phasicity is normal and symmetric with the symptomatic side. No evidence of thrombus. Normal compressibility. Common Femoral Vein: No evidence of thrombus. Normal compressibility, respiratory phasicity and response to augmentation. Saphenofemoral Junction: No evidence of thrombus. Normal compressibility and flow on color Doppler imaging. Profunda Femoral Vein: No evidence of thrombus. Normal compressibility and flow on color Doppler imaging. Femoral Vein: No evidence of thrombus. Normal compressibility, respiratory phasicity and response to augmentation. Popliteal Vein: No evidence of thrombus. Normal compressibility, respiratory phasicity and response to augmentation. Calf Veins: No evidence of thrombus. Normal compressibility and flow on color Doppler imaging. Superficial Great Saphenous Vein: No evidence of thrombus. Normal compressibility. Venous Reflux:  None. Other Findings: No evidence of superficial thrombophlebitis or abnormal fluid collection. IMPRESSION: No evidence of right  lower extremity deep venous thrombosis. Electronically Signed   By: Aletta Edouard M.D.   On: 05/05/2019 17:15   Dg Knee Right Port  Result Date: 05/04/2019 CLINICAL DATA:  Total knee arthroplasty. EXAM: PORTABLE RIGHT KNEE - 1-2 VIEW COMPARISON:  None. FINDINGS: Total knee arthroplasty. Components well positioned. No radiographically detectable complication. Fluid and air in the joint as expected. IMPRESSION: Good appearance following total knee arthroplasty. Electronically Signed   By: Nelson Chimes M.D.   On: 05/04/2019 11:33    Disposition: Discharge disposition: 03-Skilled [place] information for follow-up providers    Lattie Corns, PA-C On 05/19/2019.   Specialty: Physician Assistant Why: [email or web address] Contact information: [place] [phone]            Contact information for after-discharge care    Destination    [facility].   Service: Skilled Nursing Contact information: [address] [place] [phone]                   Signed: Feliberto Gottron 05/09/2019, 12:09 PM

## 2019-05-11 LAB — CULTURE, BLOOD (ROUTINE X 2)
Culture: NO GROWTH
Culture: NO GROWTH
Special Requests: ADEQUATE

## 2019-06-02 ENCOUNTER — Other Ambulatory Visit: Payer: Self-pay | Admitting: Family Medicine

## 2019-06-02 ENCOUNTER — Ambulatory Visit
Admission: RE | Admit: 2019-06-02 | Discharge: 2019-06-02 | Disposition: A | Discharge status: Home / Self Care | Payer: Medicare Other | Source: Ambulatory Visit | Attending: Family Medicine | Admitting: Family Medicine

## 2019-06-02 ENCOUNTER — Other Ambulatory Visit: Payer: Self-pay

## 2019-06-02 DIAGNOSIS — M7989 Other specified soft tissue disorders: Secondary | ICD-10-CM | POA: Diagnosis present

## 2019-06-09 ENCOUNTER — Other Ambulatory Visit: Payer: Self-pay | Admitting: Family Medicine

## 2019-06-09 DIAGNOSIS — M7989 Other specified soft tissue disorders: Secondary | ICD-10-CM

## 2019-06-22 ENCOUNTER — Ambulatory Visit: Admission: RE | Admit: 2019-06-22 | Payer: Medicare Other | Source: Ambulatory Visit

## 2020-07-31 IMAGING — DX PORTABLE RIGHT KNEE - 1-2 VIEW
2 series · 2 of 2 positions shown · non-contrast
Comparison: None.

CLINICAL DATA: Total knee arthroplasty.

EXAM:
PORTABLE RIGHT KNEE - 1-2 VIEW

[knee ap]
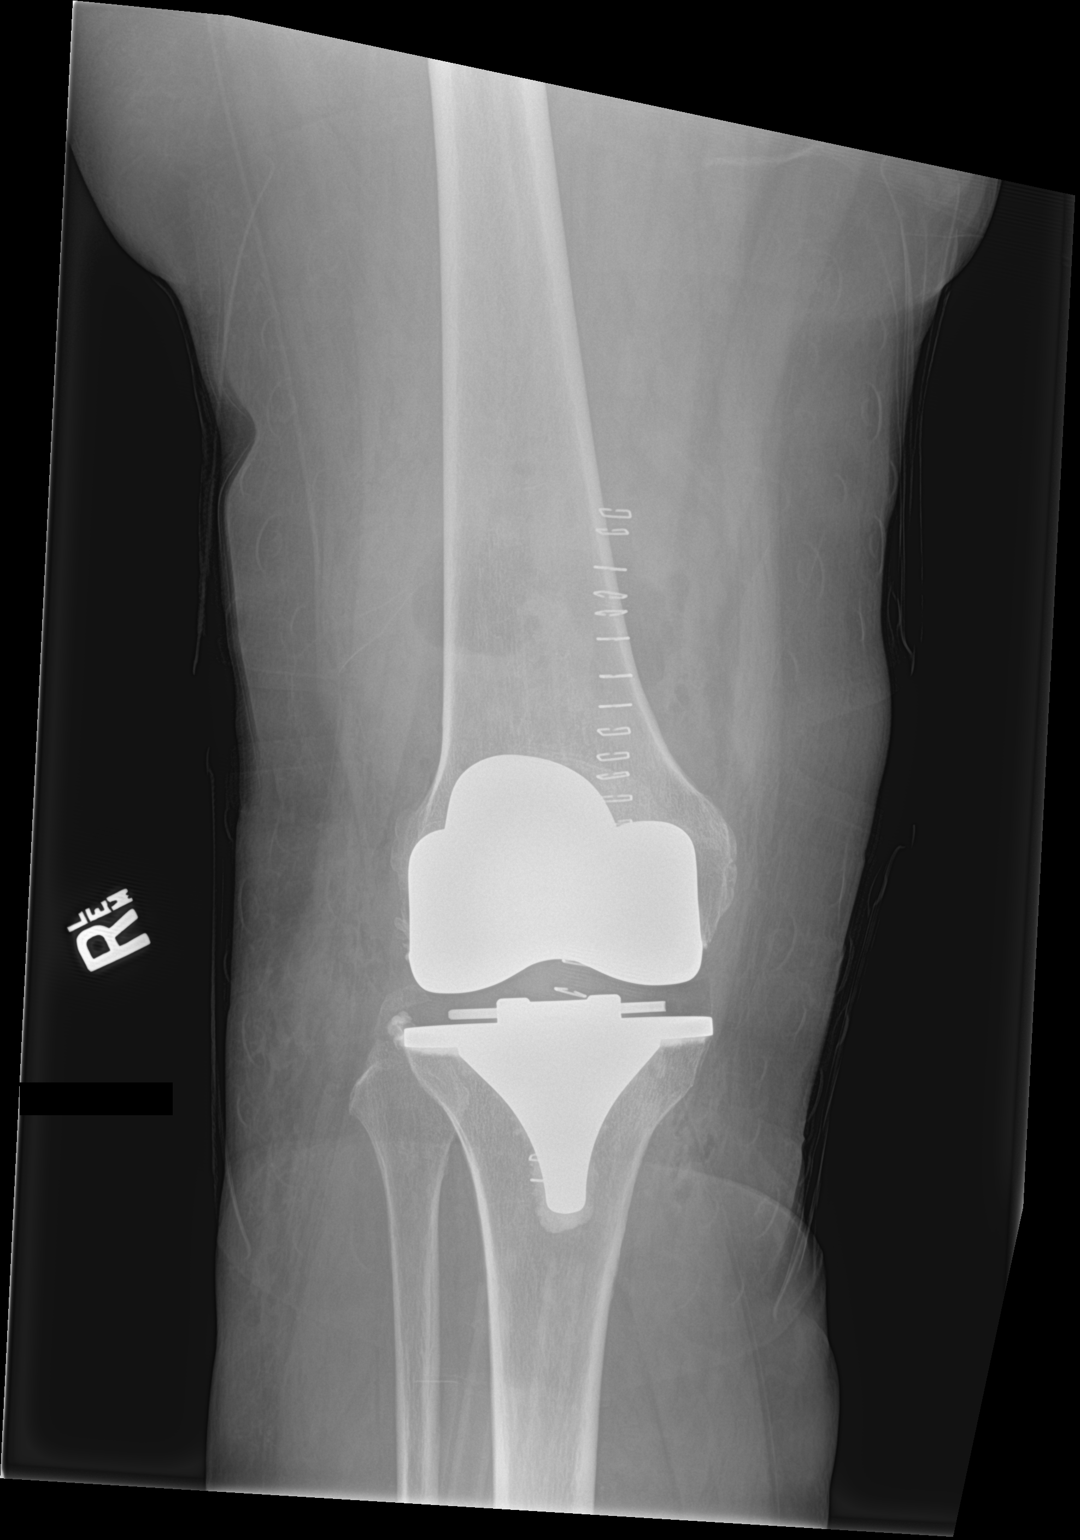

[knee lat]
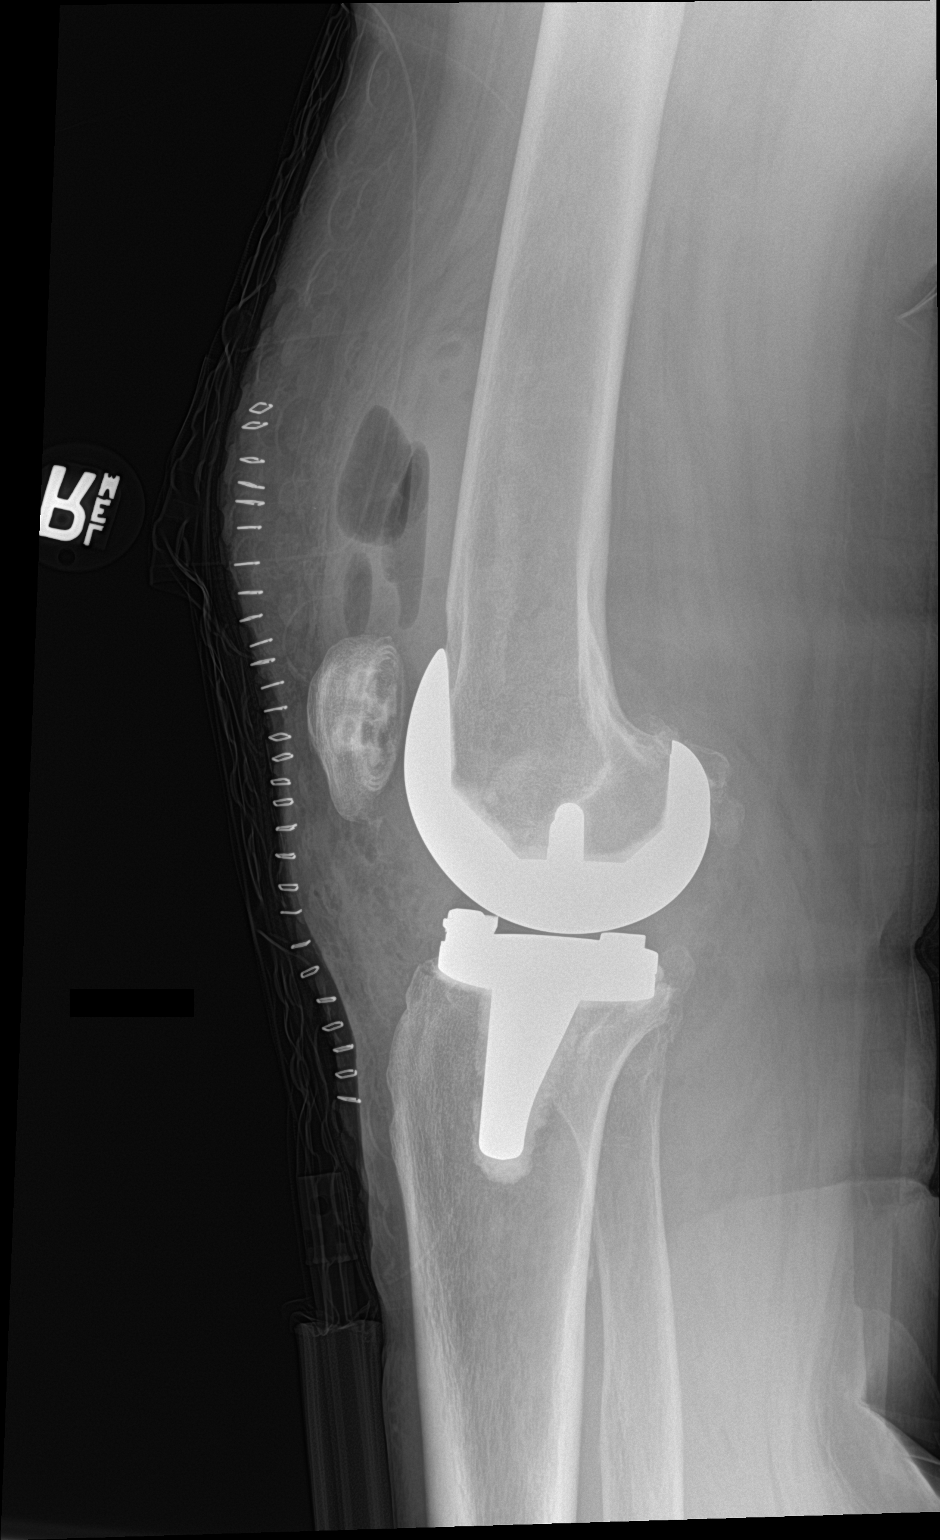

[2 of 2 positions shown; findings below may reference images not displayed]

FINDINGS: Total knee arthroplasty. Components well positioned. No
radiographically detectable complication. Fluid and air in the joint
as expected.
IMPRESSION: Good appearance following total knee arthroplasty.
